# Patient Record
Sex: Female | Born: 1960 | Hispanic: No | Marital: Married | Smoking: Never smoker
Health system: Southern US, Community
[De-identification: ages and names within clinical notes are randomized; demographics above are authoritative.]

## PROBLEM LIST (undated history)

## (undated) DIAGNOSIS — G4733 Obstructive sleep apnea (adult) (pediatric): Secondary | ICD-10-CM

## (undated) DIAGNOSIS — E119 Type 2 diabetes mellitus without complications: Secondary | ICD-10-CM

## (undated) DIAGNOSIS — I10 Essential (primary) hypertension: Secondary | ICD-10-CM

## (undated) DIAGNOSIS — E7800 Pure hypercholesterolemia, unspecified: Secondary | ICD-10-CM

## (undated) DIAGNOSIS — R7303 Prediabetes: Secondary | ICD-10-CM

## (undated) DIAGNOSIS — G4730 Sleep apnea, unspecified: Secondary | ICD-10-CM

## (undated) DIAGNOSIS — E785 Hyperlipidemia, unspecified: Secondary | ICD-10-CM

## (undated) DIAGNOSIS — T7840XA Allergy, unspecified, initial encounter: Secondary | ICD-10-CM

## (undated) HISTORY — DX: Allergy, unspecified, initial encounter: T78.40XA

## (undated) HISTORY — DX: Pure hypercholesterolemia, unspecified: E78.00

## (undated) HISTORY — DX: Essential (primary) hypertension: I10

## (undated) HISTORY — DX: Obstructive sleep apnea (adult) (pediatric): G47.33

## (undated) HISTORY — DX: Hyperlipidemia, unspecified: E78.5

## (undated) HISTORY — DX: Prediabetes: R73.03

## (undated) HISTORY — DX: Sleep apnea, unspecified: G47.30

## (undated) HISTORY — PX: COLONOSCOPY: SHX174

## (undated) HISTORY — PX: WISDOM TOOTH EXTRACTION: SHX21

## (undated) HISTORY — DX: Type 2 diabetes mellitus without complications: E11.9

---

## 2008-06-26 ENCOUNTER — Encounter: Admission: RE | Admit: 2008-06-26 | Discharge: 2008-06-26 | Payer: Self-pay | Admitting: Internal Medicine

## 2009-01-30 ENCOUNTER — Encounter: Admission: RE | Admit: 2009-01-30 | Discharge: 2009-01-30 | Payer: Self-pay | Admitting: Internal Medicine

## 2012-03-21 ENCOUNTER — Other Ambulatory Visit: Payer: Self-pay | Admitting: Internal Medicine

## 2012-03-21 DIAGNOSIS — N63 Unspecified lump in unspecified breast: Secondary | ICD-10-CM

## 2012-03-29 ENCOUNTER — Ambulatory Visit
Admission: RE | Admit: 2012-03-29 | Discharge: 2012-03-29 | Disposition: A | Discharge status: Home / Self Care | Payer: BC Managed Care – PPO | Source: Ambulatory Visit | Attending: Internal Medicine | Admitting: Internal Medicine

## 2012-03-29 DIAGNOSIS — N63 Unspecified lump in unspecified breast: Secondary | ICD-10-CM

## 2012-04-25 ENCOUNTER — Encounter: Payer: Self-pay | Admitting: Pulmonary Disease

## 2012-04-25 ENCOUNTER — Ambulatory Visit (INDEPENDENT_AMBULATORY_CARE_PROVIDER_SITE_OTHER): Payer: BC Managed Care – PPO | Admitting: Pulmonary Disease

## 2012-04-25 VITALS — BP 100/60 | HR 92 | Temp 98.4°F | Ht 58.5 in | Wt 148.6 lb

## 2012-04-25 DIAGNOSIS — G4733 Obstructive sleep apnea (adult) (pediatric): Secondary | ICD-10-CM

## 2012-04-25 NOTE — Assessment & Plan Note (Signed)
Given excessive daytime somnolence, narrow pharyngeal exam, witnessed apneas & loud snoring, obstructive sleep apnea is very likely & an overnight polysomnogram will be scheduled as a split study. The pathophysiology of obstructive sleep apnea , it's cardiovascular consequences & modes of treatment including CPAP were discused with the patient in detail & they evidenced understanding. Given absence of comorbidities or other sleep disorders such as insomnia restless legs, I think this can safely be performed as a home study. I think pretest probability is high here.

## 2012-04-25 NOTE — Progress Notes (Signed)
Subjective:    Patient ID: Frances White, female    DOB: 12-27-1960, 51 y.o.   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 She has recently been diagnosed with hypertension. Her husband has noted loud snoring for many years and lately she has been complaining of non-refreshing sleep. Epworth sleepiness score is 7/24. Her dentist has noted that she likely grinds her teeth in her sleep. Bedtime is 11:30 to midnight, sleep latency is about 15 minutes but about once every 2 weeks she has difficulty falling asleep for an hour or 2. She sleeps on her side with one pillow. She is 2-4 awakenings every night and she suspects that this may be due to carpal tunnel syndrome, there is a bathroom visit around 5 AM. She does have postmenopausal symptoms but these did not seem to wake her up at night. She is out of bed at 7 AM feeling tired but denies headaches or dryness of mouth. She does report stiff joints in the morning that improves as the day goes by. She is gained about 10 pounds in the last few years. She drinks about 4 cups of tea in the daytime with her last cup being around 4 PM. She has tried nasal steroids without any change in her snoring. 3 mg of melatonin seem to give her better rest at night.. She denies obvious sadness of mood or overt signs of depression. There is no history suggestive of cataplexy, sleep paralysis or parasomnias     Past Medical History  Diagnosis Date  . High cholesterol   . Hypertension     No past surgical history on file.  Allergies  Allergen Reactions  . Sulfa Antibiotics     rash    History   Social History  . Marital Status: Unknown    Spouse Name: N/A    Number of Children: N/A  . Years of Education: N/A   Occupational History  . Not on file.   Social History Main Topics  . Smoking status: Never Smoker   . Smokeless tobacco: Not on file  . Alcohol Use: Yes     Comment:  socially  . Drug Use: No  . Sexually Active: Not on file   Other Topics Concern  . Not on file   Social History Narrative  . No narrative on file     Review of Systems  Constitutional: Negative for appetite change and unexpected weight change.  HENT: Positive for trouble swallowing and dental problem. Negative for ear pain, congestion, sore throat and sneezing.   Respiratory: Positive for shortness of breath. Negative for cough.   Cardiovascular: Negative for chest pain, palpitations and leg swelling.  Gastrointestinal: Negative for abdominal pain.  Musculoskeletal: Positive for arthralgias. Negative for joint swelling.  Skin: Negative for rash.  Neurological: Positive for headaches.  Psychiatric/Behavioral: Negative for dysphoric mood. The patient is nervous/anxious.        Objective:   Physical Exam  Gen. Pleasant, well-nourished, in no distress, normal affect ENT - no lesions, no post nasal drip, class 2 airway Neck: No JVD, no thyromegaly, no carotid bruits Lungs: no use of accessory muscles, no dullness to percussion, clear without rales or rhonchi  Cardiovascular: Rhythm regular, heart sounds  normal, no murmurs or gallops, no peripheral edema Abdomen: soft and non-tender, no hepatosplenomegaly, BS normal. Musculoskeletal: No deformities, no cyanosis or clubbing Neuro:  alert, non focal       Assessment &  Plan:

## 2012-04-25 NOTE — Patient Instructions (Signed)
Home sleep study Trial of melatonin about 2h before bedtime - 3mg  OK

## 2012-04-28 NOTE — Addendum Note (Signed)
Addended by: Tommie Sams on: 04/28/2012 03:07 PM   Modules accepted: Orders

## 2012-05-27 ENCOUNTER — Encounter (HOSPITAL_BASED_OUTPATIENT_CLINIC_OR_DEPARTMENT_OTHER): Payer: BC Managed Care – PPO

## 2012-06-07 ENCOUNTER — Encounter (HOSPITAL_BASED_OUTPATIENT_CLINIC_OR_DEPARTMENT_OTHER): Payer: BC Managed Care – PPO

## 2012-06-28 ENCOUNTER — Encounter (HOSPITAL_BASED_OUTPATIENT_CLINIC_OR_DEPARTMENT_OTHER): Payer: Self-pay | Admitting: Radiology

## 2012-06-28 ENCOUNTER — Ambulatory Visit (HOSPITAL_BASED_OUTPATIENT_CLINIC_OR_DEPARTMENT_OTHER): Payer: BC Managed Care – PPO | Attending: Pulmonary Disease | Admitting: Radiology

## 2012-06-28 VITALS — Ht 59.0 in | Wt 149.0 lb

## 2012-06-28 DIAGNOSIS — Z79899 Other long term (current) drug therapy: Secondary | ICD-10-CM | POA: Insufficient documentation

## 2012-06-28 DIAGNOSIS — G4733 Obstructive sleep apnea (adult) (pediatric): Secondary | ICD-10-CM | POA: Insufficient documentation

## 2012-06-28 DIAGNOSIS — G4700 Insomnia, unspecified: Secondary | ICD-10-CM | POA: Insufficient documentation

## 2012-06-28 DIAGNOSIS — Z9989 Dependence on other enabling machines and devices: Secondary | ICD-10-CM

## 2012-06-29 DIAGNOSIS — G4730 Sleep apnea, unspecified: Secondary | ICD-10-CM

## 2012-06-29 DIAGNOSIS — G4710 Hypersomnia, unspecified: Secondary | ICD-10-CM

## 2012-06-30 ENCOUNTER — Telehealth: Payer: Self-pay | Admitting: Pulmonary Disease

## 2012-06-30 DIAGNOSIS — G4733 Obstructive sleep apnea (adult) (pediatric): Secondary | ICD-10-CM

## 2012-06-30 NOTE — Procedures (Signed)
NAMEKELBI, RENSTROM NO.:  [id number]  MEDICAL RECORD NO.:  [id number]          PATIENT TYPE:  OUT  LOCATION:  SLEEP CENTER                 FACILITY:  [facility]  PHYSICIAN:  Oretha Milch, MD      DATE OF BIRTH:  [birth year]  DATE OF STUDY:  06/28/2012                           NOCTURNAL POLYSOMNOGRAM  REFERRING PHYSICIAN:  Oretha Milch, MD  INDICATION FOR THE STUDY:  Ms. Cowell is a 52 year old woman with hypertension, witnessed apneas, loud snoring, and excessive daytime fatigue.  At the time of this study, she weighed 149 pounds with a height of 4 feet 11 inches, BMI of 30, neck size of 13.5 inch.  EPWORTH SLEEPINESS SCORE:  5.  BEDTIME MEDICATIONS:  Included Crestor, Benicar, melatonin, and Lunesta at 9:30 p.m.  This intervention polysomnogram was performed with a sleep technologist in attendance.  EEG, EOG, EMG, EKG, and respiratory parameters were recorded.  Sleep stages, arousals, limb movements, and respiratory data were scored according to criteria laid out by the American Academy of Sleep Medicine.  SLEEP ARCHITECTURE:  Lights out was at 11:11 p.m., lights on was at 5:11 a.m.  CPAP was initiated at 2:05 a.m.  During the diagnostic portion, total sleep time was 139 minutes with a sleep period time of 161 minute with a sleep efficiency of 80%.  Sleep latency was 12 minutes.  Latency to REM sleep was 82 minutes.  Awake after sleep onset was 23 minutes. Sleep stages of the percentage of total sleep time was N1 11%, N2 81%. N3 1%, and REM sleep 7% (10 minute).  Supine sleep accounted for 41 minute.  During the titration portion, REM sleep accounted for 33 minutes (45%) and supine sleep was only noted for 4.5 minute.  Longest period of REM sleep was around 2:30 a.m.  RESPIRATORY DATA:  There were total of 46 obstructive apneas, 0 central apneas, 0 mixed apneas, and 49 hypopneas with an apnea-hypopnea index of 41 events per hour, and a low desaturation of  73% during the diagnostic portion.  The events appeared to be decreased in the right lateral decubitus position and were mostly noted while supine.  Desaturations were severe during REM sleep.  Due to this degree of respiratory disturbance, CPAP was initiated at 5 cm with a small fullface mask.  A level of 7 cm for 28 minutes including 9 minutes of REM sleep, 2 hypopneas were noted with an AHI of 4 events per hour and a low desaturation of 88%.  At a level of 8 cm for 16 minutes of sleep, 1 obstructive apnea, 1 central apnea, and 3 hypopneas were noted. Titration was suboptimal due to lack of supine sleep.  AROUSAL DATA:  During the diagnostic portion, the arousal index was 28 events per hour.  During the titration portion, the arousal index was 12 events per hour.  OXYGEN SATURATION DATA:  The desaturation index was 35 events per hour during diagnostic portion, and during the titration portion, she spent 0.2 minutes with a saturation less than 88%, the lowest desaturation of 88%.  CARDIAC DATA:  The low heart rate was 52 beats per minute.  The high heart rate was 118 beats per minute.  No arrhythmias were noted.  DISCUSSION:  She was desensitized with a small fullface mask; 2 cm of CPR was supplied.  She was unable to maintain sleep after 4 a.m. in spite of taking Lunesta and melatonin at bedtime.  IMPRESSION: 1. Moderate obstructive sleep apnea with hypopneas causing sleep     fragmentation and moderate oxygen desaturations.  This was     especially severe during supine REM sleep and appeared improved in     the right lateral decubitus position. 2. Insomnia of sleep maintenance in spite of melatonin and Lunesta. 3. CPAP of 8 cm with a small fullface mask seemed to correct the     respiratory disturbance. 4. No evidence of limb movements, cardiac arrhythmias, or behavioral     disturbance during sleep.  RECOMMENDATION: 1. The treatment options for this degree of  sleep-disordered breathing     include weight loss and/or CPAP therapy. 2. CPAP can be initiated at 8 cm with a small fullface mask and     compliance monitored at this level. 3. She should be advised against medications, sedative side effects.     She should be cautioned against driving when sleepy.  The sleep     aids are to be continued for insomnia of sleep maintenance.  CPAP     would be advised.     Oretha Milch, MD    RVA/MEDQ  D:  06/29/2012 13:52:50  T:  06/30/2012 01:04:11  Job:  [id number]  cc:   Larina Earthly, M.D. Fax: [phone]

## 2012-06-30 NOTE — Telephone Encounter (Signed)
Discussed psg results Order sent to DME for CPAP 8 cm, nasal pillows, humidity, download in 4 wks

## 2012-07-25 ENCOUNTER — Telehealth: Payer: Self-pay | Admitting: Pulmonary Disease

## 2012-07-25 DIAGNOSIS — G4733 Obstructive sleep apnea (adult) (pediatric): Secondary | ICD-10-CM

## 2012-07-25 NOTE — Telephone Encounter (Signed)
Pt is aware of RA, recommendation Order will be placed in system.

## 2012-07-25 NOTE — Telephone Encounter (Signed)
Headache x 3ds with nasal pillows  Pl send Rx for change to nasal mask to DME this am & inform pt

## 2012-07-26 ENCOUNTER — Telehealth: Payer: Self-pay | Admitting: Pulmonary Disease

## 2012-07-26 NOTE — Telephone Encounter (Signed)
Order faxed to [facility] to get new cpap mask Tobe Sos

## 2012-08-30 ENCOUNTER — Telehealth: Payer: Self-pay | Admitting: Pulmonary Disease

## 2012-08-30 NOTE — Telephone Encounter (Signed)
ATC PT LINE BUSY WCB 

## 2012-08-30 NOTE — Telephone Encounter (Signed)
CPAP 8 cm  is very effective & cuts down events to 5/hr She is using on avg 5h on nights used Can we do anything to get her more comfortable?

## 2012-08-31 NOTE — Telephone Encounter (Signed)
lmomtcb x1 

## 2012-09-01 NOTE — Telephone Encounter (Signed)
lmomtcb x2 for pt 

## 2012-09-02 NOTE — Telephone Encounter (Signed)
I spoke with patient about results and she verbalized understanding and had no questions. She stated she has the medium nasal pillows and it cuts into her nose. She is not sure if anything can be done about this but any recs. Please advise RA thanks

## 2012-09-07 NOTE — Telephone Encounter (Signed)
Discussed - vaseline, overhead holder for hose No other issues, compliance ok

## 2013-02-14 ENCOUNTER — Telehealth: Payer: Self-pay

## 2013-02-14 NOTE — Telephone Encounter (Signed)
Message copied by Chrystie Nose on Tue Feb 14, 2013  2:06 PM ------      Message from: Hilarie Fredrickson      Created: Tue Feb 14, 2013  2:00 PM      Regarding: RE: Colonoscopy       If that works for them, that would be terrific. Thank you                  ----- Message -----         From: Lily Lovings, RN         Sent: 02/14/2013   1:29 PM           To: Hilarie Fredrickson, MD      Subject: Colonoscopy                                              Dr. Marina Goodell,            Dr. Vicente Males wife and he are scheduled for colon's with you on 02/23/13. They would like to reschedule her appt to another day in October. There were no openings but I wondered if perhaps She could be done 03/09/13 at 10:30am. That is your hospital week and you are in the [facility] that morning.            I spoke with Dr. Felipa Eth and let him know I would have to check with you and call them back.            Thanks,      Bonita Quin        ------

## 2013-02-14 NOTE — Telephone Encounter (Signed)
Pts colon moved to 03/09/13@10 :30am. Pt aware of appt and will keep scheduled previsit appt also.

## 2013-02-17 ENCOUNTER — Encounter: Payer: Self-pay | Admitting: Internal Medicine

## 2013-02-17 ENCOUNTER — Ambulatory Visit (AMBULATORY_SURGERY_CENTER): Payer: Self-pay

## 2013-02-17 VITALS — Ht 59.0 in | Wt 146.0 lb

## 2013-02-17 DIAGNOSIS — Z1211 Encounter for screening for malignant neoplasm of colon: Secondary | ICD-10-CM

## 2013-02-17 MED ORDER — MOVIPREP 100 G PO SOLR: 1.0000 | Freq: Once | ORAL | Status: DC

## 2013-02-23 ENCOUNTER — Encounter: Payer: BC Managed Care – PPO | Admitting: Internal Medicine

## 2013-03-09 ENCOUNTER — Ambulatory Visit (AMBULATORY_SURGERY_CENTER): Payer: BC Managed Care – PPO | Admitting: Internal Medicine

## 2013-03-09 ENCOUNTER — Encounter: Payer: Self-pay | Admitting: Internal Medicine

## 2013-03-09 VITALS — BP 111/75 | HR 75 | Temp 98.2°F | Resp 14 | Ht 59.0 in | Wt 146.0 lb

## 2013-03-09 DIAGNOSIS — Z1211 Encounter for screening for malignant neoplasm of colon: Secondary | ICD-10-CM

## 2013-03-09 DIAGNOSIS — D135 Benign neoplasm of extrahepatic bile ducts: Secondary | ICD-10-CM

## 2013-03-09 DIAGNOSIS — D134 Benign neoplasm of liver: Secondary | ICD-10-CM

## 2013-03-09 DIAGNOSIS — D126 Benign neoplasm of colon, unspecified: Secondary | ICD-10-CM

## 2013-03-09 MED ORDER — SODIUM CHLORIDE 0.9 % IV SOLN: 500.0000 mL | INTRAVENOUS | Status: DC

## 2013-03-09 NOTE — Progress Notes (Signed)
Patient did not experience any of the following events: a burn prior to discharge; a fall within the facility; wrong site/side/patient/procedure/implant event; or a hospital transfer or hospital admission upon discharge from the facility. (G8907)Patient did not have preoperative order for IV antibiotic SSI prophylaxis. (G8918) ewm 

## 2013-03-09 NOTE — Op Note (Signed)
[facility] [address] [place], [address]   COLONOSCOPY PROCEDURE REPORT  PATIENT: Frances White, Frances White  MR#: [id number] BIRTHDATE: 03/10/1961 , 52  yrs. old GENDER: Female ENDOSCOPIST: Roxy Cedar, MD REFERRED XB:JYNWGN Eloise Harman, M.D. PROCEDURE DATE:  03/09/2013 PROCEDURE:   Colonoscopy with snare polypectomy x 4 and Colonoscopy with biopsy x 1 First Screening Colonoscopy - Avg.  risk and is 50 yrs.  old or older Yes.  Prior Negative Screening - Now for repeat screening. N/A  History of Adenoma - Now for follow-up colonoscopy & has been > or = to 3 yrs.  N/A  Polyps Removed Today? Yes. ASA CLASS:   Class II INDICATIONS:average risk screening. MEDICATIONS: MAC sedation, administered by CRNA and propofol (Diprivan) 300mg  IV DESCRIPTION OF PROCEDURE:   After the risks benefits and alternatives of the procedure were thoroughly explained, informed consent was obtained.  A digital rectal exam revealed no abnormalities of the rectum.   The LB [id number]  endoscope was introduced through the anus and advanced to the cecum, which was identified by both the appendix and ileocecal valve. No adverse events experienced.   The quality of the prep was excellent, using MoviPrep  The instrument was then slowly withdrawn as the colon was fully examined.       COLON FINDINGS: Five polyps were found. 1mm in the cecum removed with cold biopsy forceps. 3mm, 5mm, 8mm in the ascending colon and 7mm transverse colon - all removed with cold snare. All polyps retrieved and submitted to pathology.  The colon was otherwise normal.  There was no diverticulosis, inflammation,other polyps or cancers .  Retroflexed views revealed no abnormalities. The time to cecum=2 minutes 14 seconds.  Withdrawal time=13 minutes 48 seconds. The scope was withdrawn and the procedure completed.  COMPLICATIONS: There were no complications.  ENDOSCOPIC IMPRESSION: 1.   Five polyps were found and  removed as described 2.   The colon was otherwise normal  RECOMMENDATIONS: 1. Repeat Colonoscopy in 3 years if 3 or more adenomas.   eSigned:  Roxy Cedar, MD 03/09/2013 11:13 AM   cc: Jarome Matin, MD and The Patient   PATIENT NAME:  Frances White, Frances White MR#: [id number]

## 2013-03-09 NOTE — Patient Instructions (Signed)
YOU HAD AN ENDOSCOPIC PROCEDURE TODAY AT THE [facility]: Refer to the procedure report that was given to you for any specific questions about what was found during the examination.  If the procedure report does not answer your questions, please call your gastroenterologist to clarify.  If you requested that your care partner not be given the details of your procedure findings, then the procedure report has been included in a sealed envelope for you to review at your convenience later.  YOU SHOULD EXPECT: Some feelings of bloating in the abdomen. Passage of more gas than usual.  Walking can help get rid of the air that was put into your GI tract during the procedure and reduce the bloating. If you had a lower endoscopy (such as a colonoscopy or flexible sigmoidoscopy) you may notice spotting of blood in your stool or on the toilet paper. If you underwent a bowel prep for your procedure, then you may not have a normal bowel movement for a few days.  DIET: Your first meal following the procedure should be a light meal and then it is ok to progress to your normal diet.  A half-sandwich or bowl of soup is an example of a good first meal.  Heavy or fried foods are harder to digest and may make you feel nauseous or bloated.  Likewise meals heavy in dairy and vegetables can cause extra gas to form and this can also increase the bloating.  Drink plenty of fluids but you should avoid alcoholic beverages for 24 hours.  ACTIVITY: Your care partner should take you home directly after the procedure.  You should plan to take it easy, moving slowly for the rest of the day.  You can resume normal activity the day after the procedure however you should NOT DRIVE or use heavy machinery for 24 hours (because of the sedation medicines used during the test).    SYMPTOMS TO REPORT IMMEDIATELY: A gastroenterologist can be reached at any hour.  During normal business hours, 8:30 AM to 5:00 PM Monday through Friday,  call [phone].  After hours and on weekends, please call the GI answering service at [phone]  Emergency number who will take a message and have the physician on call contact you.   Following lower endoscopy (colonoscopy or flexible sigmoidoscopy):  Excessive amounts of blood in the stool  Significant tenderness or worsening of abdominal pains  Swelling of the abdomen that is new, acute  Fever of 100F or higher  FOLLOW UP: If any biopsies were taken you will be contacted by phone or by letter within the next 1-3 weeks.  Call your gastroenterologist if you have not heard about the biopsies in 3 weeks.  Our staff will call the home number listed on your records the next business day following your procedure to check on you and address any questions or concerns that you may have at that time regarding the information given to you following your procedure. This is a courtesy call and so if there is no answer at the home number and we have not heard from you through the emergency physician on call, we will assume that you have returned to your regular daily activities without incident.  SIGNATURES/CONFIDENTIALITY: You and/or your care partner have signed paperwork which will be entered into your electronic medical record.  These signatures attest to the fact that that the information above on your After Visit Summary has been reviewed and is understood.  Full responsibility of the   confidentiality of this discharge information lies with you and/or your care-partner. 

## 2013-03-09 NOTE — Progress Notes (Signed)
Called to room to assist during endoscopic procedure.  Patient ID and intended procedure confirmed with present staff. Received instructions for my participation in the procedure from the performing physician.  

## 2013-03-10 ENCOUNTER — Telehealth: Payer: Self-pay | Admitting: *Deleted

## 2013-03-10 NOTE — Telephone Encounter (Signed)
  Follow up Call-  Call back number 03/09/2013  Post procedure Call Back phone  # [phone]  Permission to leave phone message Yes     Patient questions:  Do you have a fever, pain , or abdominal swelling? no Pain Score  0 *  Have you tolerated food without any problems? yes  Have you been able to return to your normal activities? yes  Do you have any questions about your discharge instructions: Diet   no Medications  no Follow up visit  no  Do you have questions or concerns about your Care? no  Actions: * If pain score is 4 or above: No action needed, pain <4.

## 2013-03-14 ENCOUNTER — Encounter: Payer: Self-pay | Admitting: Internal Medicine

## 2015-11-21 DIAGNOSIS — N390 Urinary tract infection, site not specified: Secondary | ICD-10-CM | POA: Diagnosis not present

## 2015-11-21 DIAGNOSIS — R8299 Other abnormal findings in urine: Secondary | ICD-10-CM | POA: Diagnosis not present

## 2015-11-26 DIAGNOSIS — R300 Dysuria: Secondary | ICD-10-CM | POA: Diagnosis not present

## 2015-11-26 DIAGNOSIS — I10 Essential (primary) hypertension: Secondary | ICD-10-CM | POA: Diagnosis not present

## 2015-12-09 DIAGNOSIS — R300 Dysuria: Secondary | ICD-10-CM | POA: Diagnosis not present

## 2015-12-09 DIAGNOSIS — N390 Urinary tract infection, site not specified: Secondary | ICD-10-CM | POA: Diagnosis not present

## 2015-12-19 DIAGNOSIS — R3915 Urgency of urination: Secondary | ICD-10-CM | POA: Insufficient documentation

## 2015-12-19 DIAGNOSIS — R339 Retention of urine, unspecified: Secondary | ICD-10-CM | POA: Diagnosis not present

## 2015-12-19 DIAGNOSIS — R319 Hematuria, unspecified: Secondary | ICD-10-CM | POA: Insufficient documentation

## 2015-12-19 DIAGNOSIS — R300 Dysuria: Secondary | ICD-10-CM | POA: Diagnosis not present

## 2015-12-27 ENCOUNTER — Encounter: Payer: Self-pay | Admitting: Internal Medicine

## 2015-12-30 ENCOUNTER — Other Ambulatory Visit: Payer: Self-pay | Admitting: Internal Medicine

## 2015-12-30 DIAGNOSIS — Z1231 Encounter for screening mammogram for malignant neoplasm of breast: Secondary | ICD-10-CM

## 2016-01-09 ENCOUNTER — Ambulatory Visit
Admission: RE | Admit: 2016-01-09 | Discharge: 2016-01-09 | Disposition: A | Discharge status: Home / Self Care | Payer: BLUE CROSS/BLUE SHIELD | Source: Ambulatory Visit | Attending: Internal Medicine | Admitting: Internal Medicine

## 2016-01-09 DIAGNOSIS — Z1231 Encounter for screening mammogram for malignant neoplasm of breast: Secondary | ICD-10-CM

## 2016-01-21 DIAGNOSIS — R339 Retention of urine, unspecified: Secondary | ICD-10-CM | POA: Diagnosis not present

## 2016-01-28 DIAGNOSIS — R3129 Other microscopic hematuria: Secondary | ICD-10-CM | POA: Insufficient documentation

## 2016-01-28 DIAGNOSIS — N9489 Other specified conditions associated with female genital organs and menstrual cycle: Secondary | ICD-10-CM | POA: Diagnosis not present

## 2016-01-28 DIAGNOSIS — R399 Unspecified symptoms and signs involving the genitourinary system: Secondary | ICD-10-CM | POA: Diagnosis not present

## 2016-01-28 DIAGNOSIS — R319 Hematuria, unspecified: Secondary | ICD-10-CM | POA: Diagnosis not present

## 2016-02-24 DIAGNOSIS — G4733 Obstructive sleep apnea (adult) (pediatric): Secondary | ICD-10-CM | POA: Diagnosis not present

## 2016-03-18 DIAGNOSIS — Z23 Encounter for immunization: Secondary | ICD-10-CM | POA: Diagnosis not present

## 2016-05-19 DIAGNOSIS — Z0000 Encounter for general adult medical examination without abnormal findings: Secondary | ICD-10-CM | POA: Diagnosis not present

## 2016-05-25 DIAGNOSIS — Z0000 Encounter for general adult medical examination without abnormal findings: Secondary | ICD-10-CM | POA: Diagnosis not present

## 2016-05-26 DIAGNOSIS — I10 Essential (primary) hypertension: Secondary | ICD-10-CM | POA: Diagnosis not present

## 2016-05-26 DIAGNOSIS — D126 Benign neoplasm of colon, unspecified: Secondary | ICD-10-CM | POA: Diagnosis not present

## 2016-05-26 DIAGNOSIS — Z0000 Encounter for general adult medical examination without abnormal findings: Secondary | ICD-10-CM | POA: Diagnosis not present

## 2016-05-26 DIAGNOSIS — G4733 Obstructive sleep apnea (adult) (pediatric): Secondary | ICD-10-CM | POA: Diagnosis not present

## 2016-05-26 DIAGNOSIS — E784 Other hyperlipidemia: Secondary | ICD-10-CM | POA: Diagnosis not present

## 2016-05-26 DIAGNOSIS — Z1389 Encounter for screening for other disorder: Secondary | ICD-10-CM | POA: Diagnosis not present

## 2016-07-27 ENCOUNTER — Encounter: Payer: Self-pay | Admitting: Internal Medicine

## 2016-10-13 DIAGNOSIS — H353131 Nonexudative age-related macular degeneration, bilateral, early dry stage: Secondary | ICD-10-CM | POA: Diagnosis not present

## 2016-12-08 ENCOUNTER — Encounter: Payer: Self-pay | Admitting: Internal Medicine

## 2017-02-03 ENCOUNTER — Ambulatory Visit (AMBULATORY_SURGERY_CENTER): Payer: Self-pay | Admitting: *Deleted

## 2017-02-03 VITALS — Ht 59.0 in | Wt 150.0 lb

## 2017-02-03 DIAGNOSIS — Z1211 Encounter for screening for malignant neoplasm of colon: Secondary | ICD-10-CM

## 2017-02-03 MED ORDER — NA SULFATE-K SULFATE-MG SULF 17.5-3.13-1.6 GM/177ML PO SOLN: 1.0000 | Freq: Once | ORAL | 0 refills | Status: AC

## 2017-02-03 NOTE — Progress Notes (Signed)
Patient denies any allergies to eggs or soy. Patient denies any problems with anesthesia/sedation. Patient denies any oxygen use at home and does not take any diet/weight loss medications. EMMI education assisgned to patient on colonoscopy, this was explained and instructions given to patient. 

## 2017-02-05 ENCOUNTER — Encounter: Payer: Self-pay | Admitting: Internal Medicine

## 2017-02-16 ENCOUNTER — Encounter: Payer: Self-pay | Admitting: Internal Medicine

## 2017-02-16 ENCOUNTER — Ambulatory Visit (AMBULATORY_SURGERY_CENTER): Payer: BLUE CROSS/BLUE SHIELD | Admitting: Internal Medicine

## 2017-02-16 VITALS — BP 107/70 | HR 76 | Temp 97.3°F | Resp 12 | Ht 59.0 in | Wt 150.0 lb

## 2017-02-16 DIAGNOSIS — D122 Benign neoplasm of ascending colon: Secondary | ICD-10-CM

## 2017-02-16 DIAGNOSIS — Z86010 Personal history of colonic polyps: Secondary | ICD-10-CM | POA: Diagnosis present

## 2017-02-16 DIAGNOSIS — Z1211 Encounter for screening for malignant neoplasm of colon: Secondary | ICD-10-CM | POA: Diagnosis not present

## 2017-02-16 MED ORDER — SODIUM CHLORIDE 0.9 % IV SOLN: 500.0000 mL | INTRAVENOUS | Status: DC

## 2017-02-16 NOTE — Progress Notes (Signed)
Spontaneous respirations throughout. VSS. Resting comfortably. To PACU on room air. Report to  RN. 

## 2017-02-16 NOTE — Patient Instructions (Signed)
**  Handouts given on polyps **   YOU HAD AN ENDOSCOPIC PROCEDURE TODAY AT THE [facility]:   Refer to the procedure report that was given to you for any specific questions about what was found during the examination.  If the procedure report does not answer your questions, please call your gastroenterologist to clarify.  If you requested that your care partner not be given the details of your procedure findings, then the procedure report has been included in a sealed envelope for you to review at your convenience later.  YOU SHOULD EXPECT: Some feelings of bloating in the abdomen. Passage of more gas than usual.  Walking can help get rid of the air that was put into your GI tract during the procedure and reduce the bloating. If you had a lower endoscopy (such as a colonoscopy or flexible sigmoidoscopy) you may notice spotting of blood in your stool or on the toilet paper. If you underwent a bowel prep for your procedure, you may not have a normal bowel movement for a few days.  Please Note:  You might notice some irritation and congestion in your nose or some drainage.  This is from the oxygen used during your procedure.  There is no need for concern and it should clear up in a day or so.  SYMPTOMS TO REPORT IMMEDIATELY:   Following lower endoscopy (colonoscopy or flexible sigmoidoscopy):  Excessive amounts of blood in the stool  Significant tenderness or worsening of abdominal pains  Swelling of the abdomen that is new, acute  Fever of 100F or higher  For urgent or emergent issues, a gastroenterologist can be reached at any hour by calling [phone].   DIET:  We do recommend a small meal at first, but then you may proceed to your regular diet.  Drink plenty of fluids but you should avoid alcoholic beverages for 24 hours.  ACTIVITY:  You should plan to take it easy for the rest of today and you should NOT DRIVE or use heavy machinery until tomorrow (because of the sedation  medicines used during the test).    FOLLOW UP: Our staff will call the number listed on your records the next business day following your procedure to check on you and address any questions or concerns that you may have regarding the information given to you following your procedure. If we do not reach you, we will leave a message.  However, if you are feeling well and you are not experiencing any problems, there is no need to return our call.  We will assume that you have returned to your regular daily activities without incident.  If any biopsies were taken you will be contacted by phone or by letter within the next 1-3 weeks.  Please call us at [phone] if you have not heard about the biopsies in 3 weeks.    SIGNATURES/CONFIDENTIALITY: You and/or your care partner have signed paperwork which will be entered into your electronic medical record.  These signatures attest to the fact that that the information above on your After Visit Summary has been reviewed and is understood.  Full responsibility of the confidentiality of this discharge information lies with you and/or your care-partner. 

## 2017-02-16 NOTE — Progress Notes (Signed)
Called to room to assist during endoscopic procedure.  Patient ID and intended procedure confirmed with present staff. Received instructions for my participation in the procedure from the performing physician.  

## 2017-02-16 NOTE — Op Note (Signed)
[place] Patient Name: Frances White Procedure Date: 02/16/2017 10:01 AM MRN: [id number] Endoscopist: Docia Chuck. Henrene Pastor , MD Age: 56 Referring MD:  Date of Birth: 1960-09-29 Gender: Female Account #: [id number] Procedure:                Colonoscopy, with cold snare polypectomy x 1 Indications:              High risk colon cancer surveillance: Personal                            history of multiple (3 or more) adenomas. Index                            examination October 2014 Medicines:                Monitored Anesthesia Care Procedure:                Pre-Anesthesia Assessment:                           - Prior to the procedure, a History and Physical                            was performed, and patient medications and                            allergies were reviewed. The patient's tolerance of                            previous anesthesia was also reviewed. The risks                            and benefits of the procedure and the sedation                            options and risks were discussed with the patient.                            All questions were answered, and informed consent                            was obtained. Prior Anticoagulants: The patient has                            taken no previous anticoagulant or antiplatelet                            agents. ASA Grade Assessment: II - A patient with                            mild systemic disease. After reviewing the risks                            and benefits, the patient was deemed in  satisfactory condition to undergo the procedure.                           After obtaining informed consent, the colonoscope                            was passed under direct vision. Throughout the                            procedure, the patient's blood pressure, pulse, and                            oxygen saturations were monitored continuously. The                            Colonoscope  was introduced through the anus and                            advanced to the the cecum, identified by                            appendiceal orifice and ileocecal valve. The                            terminal ileum, ileocecal valve, appendiceal                            orifice, and rectum were photographed. The quality                            of the bowel preparation was good. The colonoscopy                            was performed without difficulty. The patient                            tolerated the procedure well. The bowel preparation                            used was SUPREP. Scope In: 10:07:46 AM Scope Out: 10:20:54 AM Scope Withdrawal Time: 0 hours 11 minutes 42 seconds  Total Procedure Duration: 0 hours 13 minutes 8 seconds  Findings:                 The terminal ileum appeared normal.                           A 1 mm polyp was found in the ascending colon. The                            polyp was removed with a cold snare. Resection and                            retrieval were complete.  The exam was otherwise without abnormality on                            direct and retroflexion views. Complications:            No immediate complications. Estimated blood loss:                            None. Estimated Blood Loss:     Estimated blood loss: none. Impression:               - The examined portion of the ileum was normal.                           - One 1 mm polyp in the ascending colon, removed                            with a cold snare. Resected and retrieved.                           - The examination was otherwise normal on direct                            and retroflexion views. Recommendation:           - Repeat colonoscopy in 5 years for surveillance.                           - Patient has a contact number available for                            emergencies. The signs and symptoms of potential                            delayed  complications were discussed with the                            patient. Return to normal activities tomorrow.                            Written discharge instructions were provided to the                            patient.                           - Resume previous diet.                           - Continue present medications.                           - Await pathology results. Docia Chuck. Henrene Pastor, MD 02/16/2017 10:30:53 AM This report has been signed electronically.

## 2017-02-17 ENCOUNTER — Telehealth: Payer: Self-pay | Admitting: *Deleted

## 2017-02-17 NOTE — Telephone Encounter (Signed)
  Follow up Call-  Call back number 02/16/2017  Post procedure Call Back phone  # [phone]  Permission to leave phone message Yes  Some recent data might be hidden     Patient questions:  Do you have a fever, pain , or abdominal swelling? No. Pain Score  0 *  Have you tolerated food without any problems? Yes.    Have you been able to return to your normal activities? Yes.    Do you have any questions about your discharge instructions: Diet   No. Medications  No. Follow up visit  No.  Do you have questions or concerns about your Care? No.  Actions: * If pain score is 4 or above: No action needed, pain <4.

## 2017-02-22 ENCOUNTER — Encounter: Payer: Self-pay | Admitting: Internal Medicine

## 2017-05-21 DIAGNOSIS — Z0000 Encounter for general adult medical examination without abnormal findings: Secondary | ICD-10-CM | POA: Diagnosis not present

## 2017-05-21 DIAGNOSIS — I10 Essential (primary) hypertension: Secondary | ICD-10-CM | POA: Diagnosis not present

## 2017-05-27 DIAGNOSIS — I10 Essential (primary) hypertension: Secondary | ICD-10-CM | POA: Diagnosis not present

## 2017-05-27 DIAGNOSIS — R7309 Other abnormal glucose: Secondary | ICD-10-CM | POA: Diagnosis not present

## 2017-05-27 DIAGNOSIS — Z0000 Encounter for general adult medical examination without abnormal findings: Secondary | ICD-10-CM | POA: Diagnosis not present

## 2017-05-27 DIAGNOSIS — M722 Plantar fascial fibromatosis: Secondary | ICD-10-CM | POA: Diagnosis not present

## 2017-06-09 DIAGNOSIS — Z23 Encounter for immunization: Secondary | ICD-10-CM | POA: Diagnosis not present

## 2018-01-24 DIAGNOSIS — Z23 Encounter for immunization: Secondary | ICD-10-CM | POA: Diagnosis not present

## 2018-01-31 DIAGNOSIS — Z6831 Body mass index (BMI) 31.0-31.9, adult: Secondary | ICD-10-CM | POA: Diagnosis not present

## 2018-01-31 DIAGNOSIS — R05 Cough: Secondary | ICD-10-CM | POA: Diagnosis not present

## 2018-01-31 DIAGNOSIS — J019 Acute sinusitis, unspecified: Secondary | ICD-10-CM | POA: Diagnosis not present

## 2018-01-31 DIAGNOSIS — J209 Acute bronchitis, unspecified: Secondary | ICD-10-CM | POA: Diagnosis not present

## 2018-02-23 ENCOUNTER — Other Ambulatory Visit: Payer: Self-pay | Admitting: Internal Medicine

## 2018-02-23 DIAGNOSIS — Z1231 Encounter for screening mammogram for malignant neoplasm of breast: Secondary | ICD-10-CM

## 2018-02-24 ENCOUNTER — Ambulatory Visit
Admission: RE | Admit: 2018-02-24 | Discharge: 2018-02-24 | Disposition: A | Discharge status: Home / Self Care | Payer: BLUE CROSS/BLUE SHIELD | Source: Ambulatory Visit | Attending: Internal Medicine | Admitting: Internal Medicine

## 2018-02-24 DIAGNOSIS — Z1231 Encounter for screening mammogram for malignant neoplasm of breast: Secondary | ICD-10-CM

## 2018-02-25 DIAGNOSIS — J019 Acute sinusitis, unspecified: Secondary | ICD-10-CM | POA: Diagnosis not present

## 2018-02-25 DIAGNOSIS — Z6831 Body mass index (BMI) 31.0-31.9, adult: Secondary | ICD-10-CM | POA: Diagnosis not present

## 2018-05-22 ENCOUNTER — Telehealth: Payer: Self-pay | Admitting: Pulmonary Disease

## 2018-05-22 DIAGNOSIS — G4733 Obstructive sleep apnea (adult) (pediatric): Secondary | ICD-10-CM

## 2018-05-22 NOTE — Telephone Encounter (Signed)
Spouse reports increased snoring. I believe that CPAP is set at 8 cm. Please send order to DME/[facility] or new DME to change to auto CPAP 5 to 12 cm and follow-up OV with download in 1 month

## 2018-05-26 DIAGNOSIS — Z0000 Encounter for general adult medical examination without abnormal findings: Secondary | ICD-10-CM | POA: Diagnosis not present

## 2018-05-26 DIAGNOSIS — R7309 Other abnormal glucose: Secondary | ICD-10-CM | POA: Diagnosis not present

## 2018-05-26 NOTE — Telephone Encounter (Signed)
Spoke with pt's spouse Einar Grad (dpr on file), aware of recs.  Order placed to change pressure.  rov scheduled.  Nothing further needed at this time.

## 2018-06-09 DIAGNOSIS — I10 Essential (primary) hypertension: Secondary | ICD-10-CM | POA: Diagnosis not present

## 2018-06-09 DIAGNOSIS — Z1331 Encounter for screening for depression: Secondary | ICD-10-CM | POA: Diagnosis not present

## 2018-06-09 DIAGNOSIS — G4733 Obstructive sleep apnea (adult) (pediatric): Secondary | ICD-10-CM | POA: Diagnosis not present

## 2018-06-09 DIAGNOSIS — E7849 Other hyperlipidemia: Secondary | ICD-10-CM | POA: Diagnosis not present

## 2018-06-09 DIAGNOSIS — R7309 Other abnormal glucose: Secondary | ICD-10-CM | POA: Diagnosis not present

## 2018-06-09 DIAGNOSIS — Z0000 Encounter for general adult medical examination without abnormal findings: Secondary | ICD-10-CM | POA: Diagnosis not present

## 2018-06-22 ENCOUNTER — Telehealth: Payer: Self-pay | Admitting: Pulmonary Disease

## 2018-06-22 DIAGNOSIS — G4733 Obstructive sleep apnea (adult) (pediatric): Secondary | ICD-10-CM

## 2018-06-22 NOTE — Telephone Encounter (Signed)
Please see previous phone note from 1/12 and confirm with [facility] that order was received

## 2018-06-22 NOTE — Telephone Encounter (Signed)
Called and spoke with [facility] they never received the order for pressure change I have replaced the order and I have left a message for the patient to make her aware that this is being taken care of will route to [facility].

## 2018-06-22 NOTE — Telephone Encounter (Signed)
Waiting for the order to be signed first then will send it

## 2018-06-22 NOTE — Telephone Encounter (Signed)
Called and spoke with patient, she stated that her husband called and spoke with RA in regards to having her CPAP pressures changed. I do not see any documentation on this patient since 2014. Patient does have an upcoming visit with RA in March. Patient would like her pressure changed before then and have someone call her back. I have called [facility] and was placed on hold. Will call them back. RA please advise thank you.

## 2018-06-23 NOTE — Telephone Encounter (Signed)
I sent this this morning

## 2018-07-01 ENCOUNTER — Encounter: Payer: Self-pay | Admitting: General Surgery

## 2018-07-04 ENCOUNTER — Encounter: Payer: Self-pay | Admitting: General Surgery

## 2018-07-04 ENCOUNTER — Encounter: Payer: Self-pay | Admitting: Pulmonary Disease

## 2018-07-04 ENCOUNTER — Ambulatory Visit (INDEPENDENT_AMBULATORY_CARE_PROVIDER_SITE_OTHER): Payer: BLUE CROSS/BLUE SHIELD | Admitting: Pulmonary Disease

## 2018-07-04 DIAGNOSIS — G4721 Circadian rhythm sleep disorder, delayed sleep phase type: Secondary | ICD-10-CM

## 2018-07-04 DIAGNOSIS — G4733 Obstructive sleep apnea (adult) (pediatric): Secondary | ICD-10-CM | POA: Diagnosis not present

## 2018-07-04 NOTE — Assessment & Plan Note (Addendum)
Severe, mostly supine  We have changed her to auto settings 5 to 12 cm and will await download which were obtained in the next 2 to 3 weeks and tweak settings if required. She would be eligible for a new machine We discussed some change in her bedtime routine to ensure she that she has a longer duration on CPAP  Weight loss encouraged, compliance with goal of at least 4-6 hrs every night is the expectation. Advised against medications with sedative side effects Cautioned against driving when sleepy - understanding that sleepiness will vary on a day to day basis

## 2018-07-04 NOTE — Progress Notes (Signed)
Subjective:    Patient ID: Frances White, female    DOB: Mar 18, 1961, 58 y.o.   MRN: [id number]  HPI  53 year old pleasant neighbor of mine who presents to reestablish care for OSA. She was diagnosed in 2014 when overnight sleep study which showed severe OSA mostly in the supine position events were fewer in the lateral position and were corrected by CPAP of 8 cm with a small full facemask.  Based on the study she was started on CPAP and had good results with improvement in her daytime somnolence and fatigue.  She is settled with nasal pillows. Her husband has reported over the past few months that there is snoring in spite of CPAP and wondered if settings need to be recalibrated.  We changed her to auto settings 5 to 12 cm, this change was done about a week ago by DME. She denies problems with mask interface or pressure.  She has been taking 5 mg of melatonin for the past few years now.  She reports nocturnal awakenings which she attributes to either hissing sound from air leaking out through the house at that point of contact with the nasal pillows or due to wrist pain which he attributes to carpal tunnel syndrome.  Epworth sleepiness score is 8. Bedtime is around 10:30 PM, she generally will read a book on her Melanee Spry , husband likes to turn off the light so she will sometimes sleep Chest syndrome and we are treating until she falls asleep.  Many times it is about 3 AM before she gets back into her bedroom and puts on the CPAP.  She sleeps well until wake-up time which can be around 730 to 8:30 AM, feels rested without dryness of mouth or headaches. She will take an occasional nap once every 2 weeks for about 30 minutes, naps are refreshing. There is no history suggestive of cataplexy, sleep paralysis or parasomnias  Her weight is unchanged over the past 6 years.  Blood pressure is controlled on one medication and she is now a prediabetic   Significant tests/ events reviewed  NPSG 06/2012 -  149lbs -  AHI 41/h , lowest 73 %, mostly supine >> CPAP 8 cm , small FF mask   Past Medical History:  Diagnosis Date  . Allergy   . High cholesterol   . Hypertension   . Obstructive sleep apnea   . Sleep apnea    uses CPAP    Past Surgical History:  Procedure Laterality Date  . COLONOSCOPY    . WISDOM TOOTH EXTRACTION      Allergies  Allergen Reactions  . Sulfa Antibiotics Rash    rash  . Sulfasalazine Rash    rash    Social History   Socioeconomic History  . Marital status: Unknown    Spouse name: Not on file  . Number of children: Not on file  . Years of education: Not on file  . Highest education level: Not on file  Occupational History  . Not on file  Social Needs  . Financial resource strain: Not on file  . Food insecurity:    Worry: Not on file    Inability: Not on file  . Transportation needs:    Medical: Not on file    Non-medical: Not on file  Tobacco Use  . Smoking status: Never Smoker  . Smokeless tobacco: Never Used  Substance and Sexual Activity  . Alcohol use: Yes    Comment: socially-occ.  . Drug use: No  .  Sexual activity: Not on file  Lifestyle  . Physical activity:    Days per week: Not on file    Minutes per session: Not on file  . Stress: Not on file  Relationships  . Social connections:    Talks on phone: Not on file    Gets together: Not on file    Attends religious service: Not on file    Active member of club or organization: Not on file    Attends meetings of clubs or organizations: Not on file    Relationship status: Not on file  . Intimate partner violence:    Fear of current or ex partner: Not on file    Emotionally abused: Not on file    Physically abused: Not on file    Forced sexual activity: Not on file  Other Topics Concern  . Not on file  Social History Narrative  . Not on file    Family History  Problem Relation Age of Onset  . Heart disease Father   . Allergies Father   . Rheum arthritis Father   .  Hypertension Father   . Crohn's disease Daughter   . Colon cancer Neg Hx   . Pancreatic cancer Neg Hx   . Stomach cancer Neg Hx     Review of Systems Constitutional: negative for anorexia, fevers and sweats  Eyes: negative for irritation, redness and visual disturbance  Ears, nose, mouth, throat, and face: negative for earaches, epistaxis, nasal congestion and sore throat  Respiratory: negative for cough, dyspnea on exertion, sputum and wheezing  Cardiovascular: negative for chest pain, dyspnea, lower extremity edema, orthopnea, palpitations and syncope  Gastrointestinal: negative for abdominal pain, constipation, diarrhea, melena, nausea and vomiting  Genitourinary:negative for dysuria, frequency and hematuria  Hematologic/lymphatic: negative for bleeding, easy bruising and lymphadenopathy  Musculoskeletal:negative for arthralgias, muscle weakness and stiff joints  Neurological: negative for coordination problems, gait problems, headaches and weakness  Endocrine: negative for diabetic symptoms including polydipsia, polyuria and weight loss     Objective:   Physical Exam  Gen. Pleasant, obese, in no distress, normal affect ENT - no pallor,icterus, no post nasal drip, class 2 airway Neck: No JVD, no thyromegaly, no carotid bruits Lungs: no use of accessory muscles, no dullness to percussion, decreased without rales or rhonchi  Cardiovascular: Rhythm regular, heart sounds  normal, no murmurs or gallops, no peripheral edema Abdomen: soft and non-tender, no hepatosplenomegaly, BS normal. Musculoskeletal: No deformities, no cyanosis or clubbing Neuro:  alert, non focal, no tremors       Assessment & Plan:

## 2018-07-04 NOTE — Assessment & Plan Note (Signed)
We discussed measures to push her bedtime earlier, acknowledging that this may be very difficult to fix and there are no adverse health consequences from this.  She does seem to get adequate rest from her 6 to 7 hours of sleep time  Trial of taking melatonin earlier around 9 PM and light exposure for 30 minutes around 8:30 AM to push her bedtime earlier

## 2018-07-04 NOTE — Patient Instructions (Signed)
We will check report on auto settings and tweak if needed.  You have delayed sleep phase -you are a "night owl". Trial of taking melatonin earlier around 9 PM and light exposure for 30 minutes around 8:30 AM to push her bedtime earlier  We will be eligible for a new machine when you are ready

## 2018-07-05 DIAGNOSIS — R300 Dysuria: Secondary | ICD-10-CM | POA: Diagnosis not present

## 2018-07-05 DIAGNOSIS — N390 Urinary tract infection, site not specified: Secondary | ICD-10-CM | POA: Diagnosis not present

## 2018-07-07 ENCOUNTER — Encounter: Payer: Self-pay | Admitting: Pulmonary Disease

## 2018-07-07 ENCOUNTER — Encounter: Payer: Self-pay | Admitting: General Surgery

## 2018-07-20 ENCOUNTER — Telehealth: Payer: Self-pay | Admitting: Pulmonary Disease

## 2018-07-20 DIAGNOSIS — G4733 Obstructive sleep apnea (adult) (pediatric): Secondary | ICD-10-CM

## 2018-07-20 NOTE — Telephone Encounter (Signed)
I reviewed her download . Few residual events on auto 5-12 cm , avg pr 10 cm Please increase CPAP pr slightly to auto 5-14 cm, she should not feel this difference & repeat download in 2-4 weeks  Im happy she is using the machine avg 5h ,although few missed nights

## 2018-07-22 NOTE — Telephone Encounter (Signed)
Call made to patient, made aware of results. Voiced understanding. Order placed to adjust pressures. Nothing further is needed at this time.   Rigoberto Noel, MD 2 days ago     I reviewed her download . Few residual events on auto 5-12 cm , avg pr 10 cm Please increase CPAP pr slightly to auto 5-14 cm, she should not feel this difference & repeat download in 2-4 weeks  Im happy she is using the machine avg 5h ,although few missed nights

## 2018-09-20 ENCOUNTER — Telehealth: Payer: Self-pay

## 2018-09-21 NOTE — Progress Notes (Signed)
Primary Physician/Referring:  Leanna Battles, MD  Patient ID: Frances White, female    DOB: [birth year], 58 y.o.   MRN: [id number]  Chief Complaint  Patient presents with  . Hypertension    abnormal coronary calcium score  . Hyperlipidemia  . Shortness of Breath    HPI: Frances White  is a 58 y.o. female  with Hypertension, hyperlipidemia, prediabetes mellitus, OSA on CPAP referred to me for evaluation of abnormal coronary calcium score and also cardiac risk factor evaluation.  Patient has noticed decreasing exercise tolerance and dyspnea with exertional activity.  This is been ongoing for several months, states that she is able to 2 flat surface walking but does not like to do anything exertional.  She has started to dislike going on hikes with the family as she slows down everyone else.  She has not had any chest discomfort or chest tightness but admits to not being physically active.  Past Medical History:  Diagnosis Date  . Allergy   . Hyperlipidemia   . Hypertension   . OSA (obstructive sleep apnea)   . Pre-diabetes     Past Surgical History:  Procedure Laterality Date  . COLONOSCOPY    . WISDOM TOOTH EXTRACTION      Social History   Socioeconomic History  . Marital status: Unknown    Spouse name: Not on file  . Number of children: Not on file  . Years of education: Not on file  . Highest education level: Not on file  Occupational History  . Not on file  Social Needs  . Financial resource strain: Not on file  . Food insecurity:    Worry: Not on file    Inability: Not on file  . Transportation needs:    Medical: Not on file    Non-medical: Not on file  Tobacco Use  . Smoking status: Never Smoker  . Smokeless tobacco: Never Used  Substance and Sexual Activity  . Alcohol use: Yes    Comment: socially-occ.  . Drug use: No  . Sexual activity: Not on file  Lifestyle  . Physical activity:    Days per week: Not on file    Minutes per session: Not on file  . Stress:  Not on file  Relationships  . Social connections:    Talks on phone: Not on file    Gets together: Not on file    Attends religious service: Not on file    Active member of club or organization: Not on file    Attends meetings of clubs or organizations: Not on file    Relationship status: Not on file  . Intimate partner violence:    Fear of current or ex partner: Not on file    Emotionally abused: Not on file    Physically abused: Not on file    Forced sexual activity: Not on file  Other Topics Concern  . Not on file  Social History Narrative  . Not on file    Current Outpatient Medications on File Prior to Visit  Medication Sig Dispense Refill  . aspirin EC 81 MG tablet Take 81 mg by mouth daily.     . cetirizine (ZYRTEC) 10 MG tablet Take 10 mg by mouth daily.    Marland Kitchen losartan (COZAAR) 100 MG tablet Take 1 tablet by mouth daily.    . Melatonin 5 MG TABS Take 5 mg by mouth at bedtime.     . meloxicam (MOBIC) 15 MG tablet Take 15 mg by mouth  as needed.    . Methen-Hyosc-Meth Blue-Na Phos (ME/NAPHOS/MB/HYO1) 81.6 MG TABS Take 1 tablet by mouth daily as needed.    . mometasone (NASONEX) 50 MCG/ACT nasal spray Place 1 spray into the nose daily as needed.    . Multiple Vitamin (MULTIVITAMIN) tablet Take 1 tablet by mouth daily.    . Multiple Vitamins-Minerals (PRESERVISION AREDS 2) CAPS Take by mouth daily.    . rosuvastatin (CRESTOR) 10 MG tablet Take 10 mg by mouth daily.    . Semaglutide (OZEMPIC, 0.25 OR 0.5 MG/DOSE, [place]) Inject into the skin once a week.    Marland Kitchen VITAMIN D PO Take 2,000 mg by mouth daily.     No current facility-administered medications on file prior to visit.     Review of Systems  Constitution: Positive for malaise/fatigue. Negative for chills, decreased appetite and weight gain.  Cardiovascular: Positive for dyspnea on exertion. Negative for leg swelling and syncope.  Respiratory: Positive for sleep disturbances due to breathing.   Endocrine: Negative for cold  intolerance.  Hematologic/Lymphatic: Does not bruise/bleed easily.  Musculoskeletal: Negative for joint swelling.  Gastrointestinal: Negative for abdominal pain, anorexia, change in bowel habit, hematochezia and melena.  Neurological: Negative for headaches and light-headedness.  Psychiatric/Behavioral: Negative for depression and substance abuse. The patient has insomnia and is nervous/anxious.   All other systems reviewed and are negative.     Objective  Blood pressure 117/83, pulse 97, temperature 97.7 F (36.5 C), height 4' 10.5" (1.486 m), weight 144 lb (65.3 kg), SpO2 97 %. Body mass index is 29.58 kg/m.    Physical Exam  Constitutional: She appears well-nourished. No distress.  Short stature  HENT:  Head: Atraumatic.  Eyes: Conjunctivae are normal.  Neck: Neck supple. No JVD present. No thyromegaly present.  Cardiovascular: Normal rate, regular rhythm, normal heart sounds and intact distal pulses. Exam reveals no gallop.  No murmur heard. Pulmonary/Chest: Effort normal and breath sounds normal.  Abdominal: Soft. Bowel sounds are normal.  Musculoskeletal: Normal range of motion.  Neurological: She is alert.  Skin: Skin is warm and dry.  Psychiatric: She has a normal mood and affect.   Radiology: No results found.  Laboratory examination:   Labs 05/26/2018: Serum glucose 116 mg, BUN 14, creatinine 0.7, eGFR potassium 4.2, ALT minimally elevated at 44, 5-40).  HB 13.3/HCT 42.2, platelets 299.  Total cholesterol 195, triglycerides 134, HDL 51, LDL 117.  Non-HDL cholesterol 142.  A1c 6.4%.  Last TSH was normal on 06/05/2011.   Cardiac Studies:   Coronary calcium score 09/15/2018: Result Impression  IMPRESSION: Moderate calcified coronary artery plaque placing the patient at the 90th percentile for age.  Electronically Signed by: Delma Post  Result Narrative  TECHNIQUE: Thin cut axial acquisition through the heart without contrast. Standard Agatston scoring  algorithm.  INDICATION: Screening  CALCIUM SCORE: 122.  LM: 0. LAD: 90. Cx: 27. RCA: 5 PDA: 0  Cardiac anatomy: Normal heart size, no evidence of pericardial effusion Mediastinum: No adenopathy. Visible abdomen: No significant abnormality. Visible lung fields: Visible portions of the lungs are clear    Assessment   Agatston coronary artery calcium score between 100-199 - Plan: EKG 12-Lead  Hypercholesteremia - Plan: ezetimibe (ZETIA) 10 MG tablet, Lipid Panel With LDL/HDL Ratio, TSH  Essential hypertension - Plan: PCV ECHOCARDIOGRAM COMPLETE, CMP14+EGFR  OSA (obstructive sleep apnea)  Dyspnea on exertion - Plan: PCV MYOCARDIAL PERFUSION WO LEXISCAN, PCV ECHOCARDIOGRAM COMPLETE  Family history of premature CAD  Pre-diabetes - Plan: PCV MYOCARDIAL PERFUSION WO  LEXISCAN  EKG 09/22/2018: Normal sinus rhythm at the rate of 88 bpm, normal axis.  No evidence of ischemia. WITHIN NORMAL LIMITS  Recommendations:   Patient referred to me by  Dr. Janie Morning, for evaluation of decreased exercise tolerance and dyspnea and abnormal coronary calcium score, mostly located in the proximal LAD.  After review of for medical records, labs, we discussed primary prevention, she is at moderate risk for  Significant CAD, will need further evaluation. Schedule for a Exercise Nuclear stress test to evaluate for myocardial ischemia. Will schedule for an echocardiogram.   Although her lipids are much improved On Crestor, I would like to get her LDL to closer to 70.  In view patient being [place], She may not tolerate high-dose of Crestor, we'll add Zetia 10 mg, will obtain follow-up labs in 6-[redacted] weeks along with TSH.  She is also prediabetic, Dr. Philip Aspen had previously discussed with her regarding metformin. I have increased her to go on it as she is on the verge of crying diabetes mellitus.  With regard to hypertension, blood pressure is well controlled.  Physical examination is unremarkable.   She is overweight, she can make dietary changes and can decrease red meat intake and decreasing her weight by 10-15 pounds we'll make her symptoms of dyspnea and exercise tolerance also better.  I would like to see her back in 2 months for follow-up.  Adrian Prows, MD, [facility] 09/24/2018, 8:30 AM [facility] [place] Pager: [phone] Office: [phone] If no answer Cell [phone]

## 2018-09-22 ENCOUNTER — Encounter: Payer: Self-pay | Admitting: Cardiology

## 2018-09-22 ENCOUNTER — Ambulatory Visit (INDEPENDENT_AMBULATORY_CARE_PROVIDER_SITE_OTHER): Payer: BC Managed Care – PPO | Admitting: Cardiology

## 2018-09-22 ENCOUNTER — Other Ambulatory Visit: Payer: Self-pay

## 2018-09-22 VITALS — BP 117/83 | HR 97 | Temp 97.7°F | Ht 58.5 in | Wt 144.0 lb

## 2018-09-22 DIAGNOSIS — R7303 Prediabetes: Secondary | ICD-10-CM

## 2018-09-22 DIAGNOSIS — E7800 Pure hypercholesterolemia, unspecified: Secondary | ICD-10-CM | POA: Diagnosis not present

## 2018-09-22 DIAGNOSIS — R931 Abnormal findings on diagnostic imaging of heart and coronary circulation: Secondary | ICD-10-CM | POA: Diagnosis not present

## 2018-09-22 DIAGNOSIS — G4733 Obstructive sleep apnea (adult) (pediatric): Secondary | ICD-10-CM | POA: Diagnosis not present

## 2018-09-22 DIAGNOSIS — I10 Essential (primary) hypertension: Secondary | ICD-10-CM

## 2018-09-22 DIAGNOSIS — R0600 Dyspnea, unspecified: Secondary | ICD-10-CM

## 2018-09-22 DIAGNOSIS — Z8249 Family history of ischemic heart disease and other diseases of the circulatory system: Secondary | ICD-10-CM

## 2018-09-22 DIAGNOSIS — R0609 Other forms of dyspnea: Secondary | ICD-10-CM

## 2018-09-22 MED ORDER — EZETIMIBE 10 MG PO TABS: 10.0000 mg | ORAL_TABLET | Freq: Every day | ORAL | 2 refills | Status: DC

## 2018-10-07 ENCOUNTER — Ambulatory Visit (INDEPENDENT_AMBULATORY_CARE_PROVIDER_SITE_OTHER): Payer: BC Managed Care – PPO

## 2018-10-07 ENCOUNTER — Other Ambulatory Visit: Payer: Self-pay

## 2018-10-07 DIAGNOSIS — R0600 Dyspnea, unspecified: Secondary | ICD-10-CM

## 2018-10-07 DIAGNOSIS — R7303 Prediabetes: Secondary | ICD-10-CM | POA: Diagnosis not present

## 2018-10-07 DIAGNOSIS — R0609 Other forms of dyspnea: Secondary | ICD-10-CM | POA: Diagnosis not present

## 2018-10-14 DIAGNOSIS — E7849 Other hyperlipidemia: Secondary | ICD-10-CM | POA: Diagnosis not present

## 2018-10-28 ENCOUNTER — Ambulatory Visit (INDEPENDENT_AMBULATORY_CARE_PROVIDER_SITE_OTHER): Payer: BC Managed Care – PPO

## 2018-10-28 ENCOUNTER — Other Ambulatory Visit: Payer: Self-pay

## 2018-10-28 DIAGNOSIS — R0600 Dyspnea, unspecified: Secondary | ICD-10-CM

## 2018-10-28 DIAGNOSIS — I10 Essential (primary) hypertension: Secondary | ICD-10-CM | POA: Diagnosis not present

## 2018-10-28 DIAGNOSIS — R0609 Other forms of dyspnea: Secondary | ICD-10-CM

## 2018-11-28 DIAGNOSIS — I10 Essential (primary) hypertension: Secondary | ICD-10-CM | POA: Diagnosis not present

## 2018-11-28 DIAGNOSIS — E7849 Other hyperlipidemia: Secondary | ICD-10-CM | POA: Diagnosis not present

## 2018-12-01 ENCOUNTER — Other Ambulatory Visit: Payer: Self-pay

## 2018-12-01 ENCOUNTER — Encounter: Payer: Self-pay | Admitting: Cardiology

## 2018-12-01 ENCOUNTER — Ambulatory Visit: Payer: BC Managed Care – PPO | Admitting: Cardiology

## 2018-12-01 VITALS — Ht 59.0 in | Wt 143.0 lb

## 2018-12-01 DIAGNOSIS — I10 Essential (primary) hypertension: Secondary | ICD-10-CM | POA: Diagnosis not present

## 2018-12-01 DIAGNOSIS — E7800 Pure hypercholesterolemia, unspecified: Secondary | ICD-10-CM

## 2018-12-01 DIAGNOSIS — R0609 Other forms of dyspnea: Secondary | ICD-10-CM | POA: Diagnosis not present

## 2018-12-01 DIAGNOSIS — R931 Abnormal findings on diagnostic imaging of heart and coronary circulation: Secondary | ICD-10-CM

## 2018-12-01 DIAGNOSIS — R0600 Dyspnea, unspecified: Secondary | ICD-10-CM

## 2018-12-01 NOTE — Progress Notes (Signed)
Primary Physician/Referring:  Leanna Battles, MD  Patient ID: Frances White, female    DOB: 1961-04-28, 58 y.o.   MRN: [id number]  Virtual Visit via Video Note: This visit type was conducted due to national recommendations for restrictions regarding the COVID-19 Pandemic (e.g. social distancing).  This format is felt to be most appropriate for this patient at this time.  All issues noted in this document were discussed and addressed.  No physical exam was performed (except for noted visual exam findings with Telehealth visits).  The patient has consented to conduct a Telehealth visit and understands insurance will be billed.   I connected with@, on 12/01/18 at  by a video enabled telemedicine application and verified that I am speaking with the correct person using two identifiers.   I discussed the limitations of evaluation and management by telemedicine and the availability of in person appointments. The patient expressed understanding and agreed to proceed.   I have discussed with patient regarding the safety during COVID Pandemic and steps and precautions to be taken including social distancing, frequent hand wash and use of detergent soap, gels with the patient. I asked the patient to avoid touching mouth, nose, eyes, ears with the hands. I encouraged regular walking around the neighborhood and exercise and regular diet, as long as social distancing can be maintained.   Chief Complaint  Patient presents with   Shortness of Breath   Hypertension   Follow-up    HPI: Frances White  is a 58 y.o. female  with Hypertension, hyperlipidemia, prediabetes mellitus, OSA on CPAP presents for f/u evaluation of abnormal coronary calcium score and also cardiac risk factor evaluation.  Due to reduced exercise tolerance, she underwent stress testing and echo which she is aware of the results and since then has started to exercise regularly. No other specific complaints.   Past Medical History:  Diagnosis  Date   Allergy    Hyperlipidemia    Hypertension    OSA (obstructive sleep apnea)    Pre-diabetes     Past Surgical History:  Procedure Laterality Date   COLONOSCOPY     WISDOM TOOTH EXTRACTION      Social History   Socioeconomic History   Marital status: Married    Spouse name: Not on file   Number of children: 2   Years of education: Not on file   Highest education level: Not on file  Occupational History   Not on file  Social Needs   Financial resource strain: Not on file   Food insecurity    Worry: Not on file    Inability: Not on file   Transportation needs    Medical: Not on file    Non-medical: Not on file  Tobacco Use   Smoking status: Never Smoker   Smokeless tobacco: Never Used  Substance and Sexual Activity   Alcohol use: Yes    Comment: socially-occ.   Drug use: No   Sexual activity: Not on file  Lifestyle   Physical activity    Days per week: Not on file    Minutes per session: Not on file   Stress: Not on file  Relationships   Social connections    Talks on phone: Not on file    Gets together: Not on file    Attends religious service: Not on file    Active member of club or organization: Not on file    Attends meetings of clubs or organizations: Not on file    Relationship  status: Not on file   Intimate partner violence    Fear of current or ex partner: Not on file    Emotionally abused: Not on file    Physically abused: Not on file    Forced sexual activity: Not on file  Other Topics Concern   Not on file  Social History Narrative   Not on file    Current Outpatient Medications on File Prior to Visit  Medication Sig Dispense Refill   aspirin EC 81 MG tablet Take 81 mg by mouth daily.      cetirizine (ZYRTEC) 10 MG tablet Take 10 mg by mouth daily.     ezetimibe (ZETIA) 10 MG tablet Take 1 tablet (10 mg total) by mouth daily. 30 tablet 2   losartan (COZAAR) 100 MG tablet Take 1 tablet by mouth daily.       Melatonin 5 MG TABS Take 5 mg by mouth at bedtime.      meloxicam (MOBIC) 15 MG tablet Take 15 mg by mouth as needed.     Methen-Hyosc-Meth Blue-Na Phos (ME/NAPHOS/MB/HYO1) 81.6 MG TABS Take 1 tablet by mouth daily as needed.     mometasone (NASONEX) 50 MCG/ACT nasal spray Place 1 spray into the nose daily as needed.     Multiple Vitamin (MULTIVITAMIN) tablet Take 1 tablet by mouth daily.     Multiple Vitamins-Minerals (PRESERVISION AREDS 2) CAPS Take by mouth daily.     rosuvastatin (CRESTOR) 10 MG tablet Take 10 mg by mouth daily.     SEMAGLUTIDE PO Take 9 mg by mouth daily.     VITAMIN D PO Take 2,000 mg by mouth daily.     No current facility-administered medications on file prior to visit.    Review of Systems  Constitution: Positive for malaise/fatigue. Negative for chills, decreased appetite and weight gain.  Cardiovascular: Positive for dyspnea on exertion. Negative for leg swelling and syncope.  Respiratory: Positive for sleep disturbances due to breathing.   Endocrine: Negative for cold intolerance.  Hematologic/Lymphatic: Does not bruise/bleed easily.  Musculoskeletal: Negative for joint swelling.  Gastrointestinal: Negative for abdominal pain, anorexia, change in bowel habit, hematochezia and melena.  Neurological: Negative for headaches and light-headedness.  Psychiatric/Behavioral: Negative for depression and substance abuse. The patient has insomnia and is nervous/anxious.   All other systems reviewed and are negative.  Objective  Height 4' 11"  (1.499 m), weight 143 lb (64.9 kg). Body mass index is 28.88 kg/m.   Physical exam not performed or limited due to virtual visit.  Patient appeared to be in no distress, Neck was supple, respiration was not labored.  Please see exam details from prior visit is as below.  Physical Exam  Constitutional: She appears well-nourished. No distress.  Short stature  HENT:  Head: Atraumatic.  Eyes: Conjunctivae are normal.   Neck: Neck supple. No JVD present. No thyromegaly present.  Cardiovascular: Normal rate, regular rhythm, normal heart sounds and intact distal pulses. Exam reveals no gallop.  No murmur heard. Pulmonary/Chest: Effort normal and breath sounds normal.  Abdominal: Soft. Bowel sounds are normal.  Musculoskeletal: Normal range of motion.  Neurological: She is alert.  Skin: Skin is warm and dry.  Psychiatric: She has a normal mood and affect.   Laboratory examination:   Labs 11/28/18/20: Serum glucose 90 mg, BUN 11, creatinine 0.8, eGFR greater than 60 mL, potassium 4.4,  Total cholesterol 170, triglycerides 102, HDL 53, LDL 97.  Non-HDL cholesterol 117.  10/14/2018: A1c 5.2%, serum glucose 95 mg, BUN  9, creatinine 0.7, eGFR greater than 61, potassium 4.2, CMP normal.   Total cholesterol 164, triglycerides 181, HDL 48, LDL 80.  Non-HDL cholesterol 116.  Labs 05/26/2018: Serum glucose 116 mg, BUN 14, creatinine 0.7, eGFR potassium 4.2, ALT minimally elevated at 44, 5-40).  HB 13.3/HCT 42.2, platelets 299.  Total cholesterol 195, triglycerides 134, HDL 51, LDL 117.  Non-HDL cholesterol 142.  A1c 6.4%.  Last TSH was normal on 06/05/2011.  Cardiac Studies:   Coronary calcium score 09/15/2018: Result Impression  IMPRESSION: Moderate calcified coronary artery plaque placing the patient at the 90th percentile for age.  Electronically Signed by: Delma Post  Result Narrative  CALCIUM SCORE: 122.  LM: 0. LAD: 90. Cx: 27. RCA: 5 PDA: 0  Cardiac anatomy: Normal heart size, no evidence of pericardial effusion Mediastinum: No adenopathy. Visible abdomen: No significant abnormality. Visible lung fields: Visible portions of the lungs are clear   Echocardiogram 10/28/2018 :  Normal LV systolic function with EF 57%. Left ventricle cavity is normal in size. Normal global wall motion. Doppler evidence of grade II (pseudonormal) diastolic dysfunction. Diastolic dysfunction findings suggests elevated  LA/LV end diastolic pressure. Calculated EF 57%. Structurally normal appearing tricuspid valve. Mild tricuspid regurgitation. No evidence of pulmonary hypertension.  Exercise Sestamibi stress test 10/07/2018: Exercise treadmill stress test was performed using Bruce protocol. Patient walked 7:46 min, achieving 9.7 METS, and maximum heart rate of 140 beats per minute, which is 85% of maximum predicted heart rate. Hemodynamic response was normal. Exercise capacity was normal.  Myocardial pefusion imaging is normal. Left ventricular ejection fraction is  66% with normal wall motion. Low risk study.   Assessment     ICD-10-CM   1. Agatston coronary artery calcium score between 100-199  R93.1   2. Hypercholesteremia  E78.00   3. Essential hypertension  I10   4. Dyspnea on exertion  R06.09     EKG 09/22/2018: Normal sinus rhythm at the rate of 88 bpm, normal axis.  No evidence of ischemia. WITHIN NORMAL LIMITS  Recommendations:    Reviewed her labs, stress test again with the patient, her reduced exercise tolerance may be related to deconditioning and grade 2 diastolic dysfunction.  Blood pressure is well controlled per history and also reviewed the labs with regard to lipids, since starting Crestor/Zetia combination, non-HDL cholesterol is at goal, she is also making lifestyle changes.  Hence I'll recommend continuing present medical therapy with present dose of medications.  I discussed with her regarding diastolic dysfunction and increased her to increase her physical activity as tolerated and to push himself to improve aerobic tolerance.  She was pleased with explanation, I'll see her back in 6 months for follow-up to improve compliance with regard to exercise, weight loss and dietary modification.  Adrian Prows, MD, [facility] 12/01/2018, 8:28 PM [facility] [place] Pager: [phone] Office: [phone] If no answer Cell [phone]

## 2018-12-21 ENCOUNTER — Other Ambulatory Visit: Payer: Self-pay | Admitting: Cardiology

## 2018-12-21 DIAGNOSIS — E7800 Pure hypercholesterolemia, unspecified: Secondary | ICD-10-CM

## 2019-05-02 DIAGNOSIS — Z20828 Contact with and (suspected) exposure to other viral communicable diseases: Secondary | ICD-10-CM | POA: Diagnosis not present

## 2019-06-08 DIAGNOSIS — I10 Essential (primary) hypertension: Secondary | ICD-10-CM | POA: Diagnosis not present

## 2019-06-08 DIAGNOSIS — Z0000 Encounter for general adult medical examination without abnormal findings: Secondary | ICD-10-CM | POA: Diagnosis not present

## 2019-06-08 DIAGNOSIS — R739 Hyperglycemia, unspecified: Secondary | ICD-10-CM | POA: Diagnosis not present

## 2019-06-08 DIAGNOSIS — Z20828 Contact with and (suspected) exposure to other viral communicable diseases: Secondary | ICD-10-CM | POA: Diagnosis not present

## 2019-06-09 ENCOUNTER — Ambulatory Visit: Payer: BC Managed Care – PPO | Admitting: Cardiology

## 2019-06-15 DIAGNOSIS — Z1331 Encounter for screening for depression: Secondary | ICD-10-CM | POA: Diagnosis not present

## 2019-06-15 DIAGNOSIS — R634 Abnormal weight loss: Secondary | ICD-10-CM | POA: Diagnosis not present

## 2019-06-15 DIAGNOSIS — R739 Hyperglycemia, unspecified: Secondary | ICD-10-CM | POA: Diagnosis not present

## 2019-06-15 DIAGNOSIS — E785 Hyperlipidemia, unspecified: Secondary | ICD-10-CM | POA: Diagnosis not present

## 2019-06-15 DIAGNOSIS — I10 Essential (primary) hypertension: Secondary | ICD-10-CM | POA: Diagnosis not present

## 2019-09-11 DIAGNOSIS — H353131 Nonexudative age-related macular degeneration, bilateral, early dry stage: Secondary | ICD-10-CM | POA: Diagnosis not present

## 2019-09-11 DIAGNOSIS — H2513 Age-related nuclear cataract, bilateral: Secondary | ICD-10-CM | POA: Diagnosis not present

## 2019-09-11 DIAGNOSIS — H524 Presbyopia: Secondary | ICD-10-CM | POA: Diagnosis not present

## 2019-09-11 DIAGNOSIS — H5213 Myopia, bilateral: Secondary | ICD-10-CM | POA: Diagnosis not present

## 2019-12-04 ENCOUNTER — Ambulatory Visit: Payer: BC Managed Care – PPO | Admitting: Cardiology

## 2019-12-19 DIAGNOSIS — U071 COVID-19: Secondary | ICD-10-CM | POA: Diagnosis not present

## 2019-12-19 DIAGNOSIS — Z1152 Encounter for screening for COVID-19: Secondary | ICD-10-CM | POA: Diagnosis not present

## 2020-03-22 DIAGNOSIS — Z23 Encounter for immunization: Secondary | ICD-10-CM | POA: Diagnosis not present

## 2020-06-17 DIAGNOSIS — Z0000 Encounter for general adult medical examination without abnormal findings: Secondary | ICD-10-CM | POA: Diagnosis not present

## 2020-06-17 DIAGNOSIS — E785 Hyperlipidemia, unspecified: Secondary | ICD-10-CM | POA: Diagnosis not present

## 2020-06-17 DIAGNOSIS — R739 Hyperglycemia, unspecified: Secondary | ICD-10-CM | POA: Diagnosis not present

## 2020-06-20 DIAGNOSIS — Z0000 Encounter for general adult medical examination without abnormal findings: Secondary | ICD-10-CM | POA: Diagnosis not present

## 2020-06-20 DIAGNOSIS — R82998 Other abnormal findings in urine: Secondary | ICD-10-CM | POA: Diagnosis not present

## 2020-06-20 DIAGNOSIS — R739 Hyperglycemia, unspecified: Secondary | ICD-10-CM | POA: Diagnosis not present

## 2020-06-20 DIAGNOSIS — I10 Essential (primary) hypertension: Secondary | ICD-10-CM | POA: Diagnosis not present

## 2020-07-03 DIAGNOSIS — Z20828 Contact with and (suspected) exposure to other viral communicable diseases: Secondary | ICD-10-CM | POA: Diagnosis not present

## 2020-09-27 DIAGNOSIS — H524 Presbyopia: Secondary | ICD-10-CM | POA: Diagnosis not present

## 2020-09-27 DIAGNOSIS — H2513 Age-related nuclear cataract, bilateral: Secondary | ICD-10-CM | POA: Diagnosis not present

## 2020-09-27 DIAGNOSIS — H5213 Myopia, bilateral: Secondary | ICD-10-CM | POA: Diagnosis not present

## 2020-09-27 DIAGNOSIS — R7303 Prediabetes: Secondary | ICD-10-CM | POA: Diagnosis not present

## 2020-11-19 ENCOUNTER — Other Ambulatory Visit: Payer: Self-pay | Admitting: Internal Medicine

## 2020-11-19 DIAGNOSIS — Z1231 Encounter for screening mammogram for malignant neoplasm of breast: Secondary | ICD-10-CM

## 2020-12-06 ENCOUNTER — Other Ambulatory Visit: Payer: Self-pay

## 2020-12-06 ENCOUNTER — Ambulatory Visit
Admission: RE | Admit: 2020-12-06 | Discharge: 2020-12-06 | Disposition: A | Discharge status: Home / Self Care | Payer: BLUE CROSS/BLUE SHIELD | Source: Ambulatory Visit | Attending: Internal Medicine | Admitting: Internal Medicine

## 2020-12-06 DIAGNOSIS — Z1231 Encounter for screening mammogram for malignant neoplasm of breast: Secondary | ICD-10-CM | POA: Diagnosis not present

## 2021-02-19 DIAGNOSIS — Z23 Encounter for immunization: Secondary | ICD-10-CM | POA: Diagnosis not present

## 2021-03-24 DIAGNOSIS — Z23 Encounter for immunization: Secondary | ICD-10-CM | POA: Diagnosis not present

## 2021-07-29 DIAGNOSIS — R739 Hyperglycemia, unspecified: Secondary | ICD-10-CM | POA: Diagnosis not present

## 2021-07-29 DIAGNOSIS — E785 Hyperlipidemia, unspecified: Secondary | ICD-10-CM | POA: Diagnosis not present

## 2021-07-31 DIAGNOSIS — I10 Essential (primary) hypertension: Secondary | ICD-10-CM | POA: Diagnosis not present

## 2021-07-31 DIAGNOSIS — Z0000 Encounter for general adult medical examination without abnormal findings: Secondary | ICD-10-CM | POA: Diagnosis not present

## 2021-07-31 DIAGNOSIS — Z1331 Encounter for screening for depression: Secondary | ICD-10-CM | POA: Diagnosis not present

## 2021-07-31 DIAGNOSIS — R739 Hyperglycemia, unspecified: Secondary | ICD-10-CM | POA: Diagnosis not present

## 2021-08-26 ENCOUNTER — Telehealth: Payer: Self-pay | Admitting: Pulmonary Disease

## 2021-08-26 DIAGNOSIS — G4733 Obstructive sleep apnea (adult) (pediatric): Secondary | ICD-10-CM

## 2021-08-26 NOTE — Telephone Encounter (Signed)
I have called the patient and was unable to leave a message but she would need to be a new patient. Please call to make a sleep consult with a nurse practitioner.  ?

## 2021-08-29 NOTE — Telephone Encounter (Signed)
Dr. Elsworth Soho, please advise on this. ?

## 2021-08-29 NOTE — Telephone Encounter (Signed)
Called and spoke with patient. She is aware that the order will be placed. She does not have a preference of the DME company.  ? ?Order has been placed.  ? ?Nothing further needed at time of call.  ?

## 2021-08-29 NOTE — Telephone Encounter (Signed)
Contacted pt and scheduled for first available with RA (pt did not want to see anyone else) on 6/1. Pt wants to now if RA would "please, please, please" give her a new prescription for a CPAP before then as she is fearful she is inhesting mold with the recalled one. States he can "fiddle with the settings later as she just wants the new machine." ?

## 2021-09-18 DIAGNOSIS — G4733 Obstructive sleep apnea (adult) (pediatric): Secondary | ICD-10-CM | POA: Diagnosis not present

## 2021-09-30 DIAGNOSIS — H353131 Nonexudative age-related macular degeneration, bilateral, early dry stage: Secondary | ICD-10-CM | POA: Diagnosis not present

## 2021-09-30 DIAGNOSIS — R7303 Prediabetes: Secondary | ICD-10-CM | POA: Diagnosis not present

## 2021-09-30 DIAGNOSIS — H5213 Myopia, bilateral: Secondary | ICD-10-CM | POA: Diagnosis not present

## 2021-09-30 DIAGNOSIS — H2513 Age-related nuclear cataract, bilateral: Secondary | ICD-10-CM | POA: Diagnosis not present

## 2021-10-09 ENCOUNTER — Ambulatory Visit (INDEPENDENT_AMBULATORY_CARE_PROVIDER_SITE_OTHER): Payer: BC Managed Care – PPO | Admitting: Pulmonary Disease

## 2021-10-09 ENCOUNTER — Encounter: Payer: Self-pay | Admitting: Pulmonary Disease

## 2021-10-09 DIAGNOSIS — G4733 Obstructive sleep apnea (adult) (pediatric): Secondary | ICD-10-CM | POA: Diagnosis not present

## 2021-10-09 DIAGNOSIS — G4721 Circadian rhythm sleep disorder, delayed sleep phase type: Secondary | ICD-10-CM | POA: Diagnosis not present

## 2021-10-09 NOTE — Patient Instructions (Signed)
  X CPAP is working well on auto settings  We discussed issues with doxepin

## 2021-10-09 NOTE — Assessment & Plan Note (Signed)
CPAP download was reviewed which shows excellent control of events on auto settings with average pressure of 11 and max pressure of 12 cm.  Leak is minimal.  She is very compliant since obtaining a new machine, average usage about 5.5 hours per night. CPAP is only helped improve her daytime somnolence and fatigue  We discussed alternatives including dental appliance and hypoglossal nerve implant

## 2021-10-09 NOTE — Assessment & Plan Note (Signed)
PCP has her on doxepin which has helped her anchor her bedtime to between 10 and 11 PM.  We did discuss the possibility of tachyphylaxis and needing higher dose of medications.  She will try nights off and substituting with melatonin instead. We discussed alternatives including trazodone and Ambien. We will tackle this again next year

## 2021-10-09 NOTE — Progress Notes (Signed)
Subjective:    Patient ID: Frances White, female    DOB: August 11, 1960, 61 y.o.   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 She was diagnosed in 2014 with severe OSA especially in the supine position.  Initially was maintained on CPAP of 8 cm with nasal pillows and subsequently due to persistent snoring change to auto CPAP 5 to 12 cm.  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  PCP has prescribed doxepin which she has been using for a few months now with good results  PMH -hypertension, prediabetes  Bedtime is between 10 and 11 PM, she takes doxepin about an hour prior to bedtime, sleep latency is minimal with this medication, she reports 1-2 nocturnal awakenings and is out of bed around 8 AM feeling rested without dryness of mouth or headaches. Epworth sleepiness score was 5  There is no history suggestive of cataplexy, sleep paralysis or parasomnias    Significant tests/ events reviewed  NPSG 06/2012 - 149lbs -  AHI 41/h , lowest 73 %, mostly supine >> CPAP 8 cm , small FF mask    Past Medical History:  Diagnosis Date   Allergy    Hyperlipidemia    Hypertension    OSA (obstructive sleep apnea)    Pre-diabetes     Past Surgical History:  Procedure Laterality Date   COLONOSCOPY     WISDOM TOOTH EXTRACTION      Allergies  Allergen Reactions   Sulfa Antibiotics Rash    rash   Sulfasalazine Rash    rash    . Social History   Socioeconomic History   Marital status: Married    Spouse name: Not on file   Number of children: 2   Years of education: Not on file   Highest education level: Not on file  Occupational History   Not on file  Tobacco Use   Smoking status: Never   Smokeless tobacco: Never  Vaping Use   Vaping Use: Never used   Substance and Sexual Activity   Alcohol use: Yes    Comment: socially-occ.   Drug use: No   Sexual activity: Not on file  Other Topics Concern   Not on file  Social History Narrative   Not on file   Social Determinants of Health   Financial Resource Strain: Not on file  Food Insecurity: Not on file  Transportation Needs: Not on file  Physical Activity: Not on file  Stress: Not on file  Social Connections: Not on file  Intimate Partner Violence: Not on file      Family History  Problem Relation Age of Onset   Hypertension Mother    Heart disease Father    Allergies Father    Rheum arthritis Father    Hypertension Father    Crohn's disease Daughter    Colon cancer Neg Hx    Pancreatic cancer Neg Hx    Stomach cancer Neg Hx    Breast cancer Neg Hx      Review of Systems neg for any significant sore throat, dysphagia, itching, sneezing, nasal congestion or excess/ purulent secretions, fever, chills, sweats, unintended wt loss, pleuritic or exertional cp, hempoptysis, orthopnea pnd or change in chronic leg swelling. Also denies presyncope, palpitations, heartburn, abdominal  pain, nausea, vomiting, diarrhea or change in bowel or urinary habits, dysuria,hematuria, rash, arthralgias, visual complaints, headache, numbness weakness or ataxia.     Objective:   Physical Exam   Gen. Pleasant, well-nourished, in no distress, normal affect ENT - no pallor,icterus, no post nasal drip, class 2 airway Neck: No JVD, no thyromegaly, no carotid bruits Lungs: no use of accessory muscles, no dullness to percussion, clear without rales or rhonchi  Cardiovascular: Rhythm regular, heart sounds  normal, no murmurs or gallops, no peripheral edema Abdomen: soft and non-tender, no hepatosplenomegaly, BS normal. Musculoskeletal: No deformities, no cyanosis or clubbing Neuro:  alert, non focal        Assessment & Plan:

## 2021-10-19 DIAGNOSIS — G4733 Obstructive sleep apnea (adult) (pediatric): Secondary | ICD-10-CM | POA: Diagnosis not present

## 2021-11-18 DIAGNOSIS — G4733 Obstructive sleep apnea (adult) (pediatric): Secondary | ICD-10-CM | POA: Diagnosis not present

## 2021-12-19 ENCOUNTER — Other Ambulatory Visit: Payer: Self-pay | Admitting: Internal Medicine

## 2021-12-19 DIAGNOSIS — G4733 Obstructive sleep apnea (adult) (pediatric): Secondary | ICD-10-CM | POA: Diagnosis not present

## 2021-12-19 DIAGNOSIS — Z1231 Encounter for screening mammogram for malignant neoplasm of breast: Secondary | ICD-10-CM

## 2021-12-26 ENCOUNTER — Ambulatory Visit
Admission: RE | Admit: 2021-12-26 | Discharge: 2021-12-26 | Disposition: A | Discharge status: Home / Self Care | Payer: BC Managed Care – PPO | Source: Ambulatory Visit | Attending: Internal Medicine | Admitting: Internal Medicine

## 2021-12-26 DIAGNOSIS — Z1231 Encounter for screening mammogram for malignant neoplasm of breast: Secondary | ICD-10-CM

## 2022-02-04 DIAGNOSIS — N390 Urinary tract infection, site not specified: Secondary | ICD-10-CM | POA: Diagnosis not present

## 2022-02-04 DIAGNOSIS — Z23 Encounter for immunization: Secondary | ICD-10-CM | POA: Diagnosis not present

## 2022-03-03 ENCOUNTER — Encounter: Payer: Self-pay | Admitting: Internal Medicine

## 2022-07-30 DIAGNOSIS — E785 Hyperlipidemia, unspecified: Secondary | ICD-10-CM | POA: Diagnosis not present

## 2022-07-30 DIAGNOSIS — K219 Gastro-esophageal reflux disease without esophagitis: Secondary | ICD-10-CM | POA: Diagnosis not present

## 2022-07-30 DIAGNOSIS — R7989 Other specified abnormal findings of blood chemistry: Secondary | ICD-10-CM | POA: Diagnosis not present

## 2022-07-30 DIAGNOSIS — R739 Hyperglycemia, unspecified: Secondary | ICD-10-CM | POA: Diagnosis not present

## 2022-07-30 DIAGNOSIS — I10 Essential (primary) hypertension: Secondary | ICD-10-CM | POA: Diagnosis not present

## 2022-10-14 IMAGING — MG MM DIGITAL SCREENING BILAT W/ TOMO AND CAD
8 series · 9 of 24 positions shown · non-contrast
Comparison: Previous exam(s).

CLINICAL DATA: Screening.

EXAM:
DIGITAL SCREENING BILATERAL MAMMOGRAM WITH TOMOSYNTHESIS AND CAD
TECHNIQUE: Bilateral screening digital craniocaudal and mediolateral oblique
mammograms were obtained. Bilateral screening digital breast
tomosynthesis was performed. The images were evaluated with
computer-aided detection.

[L CC synth-2D]
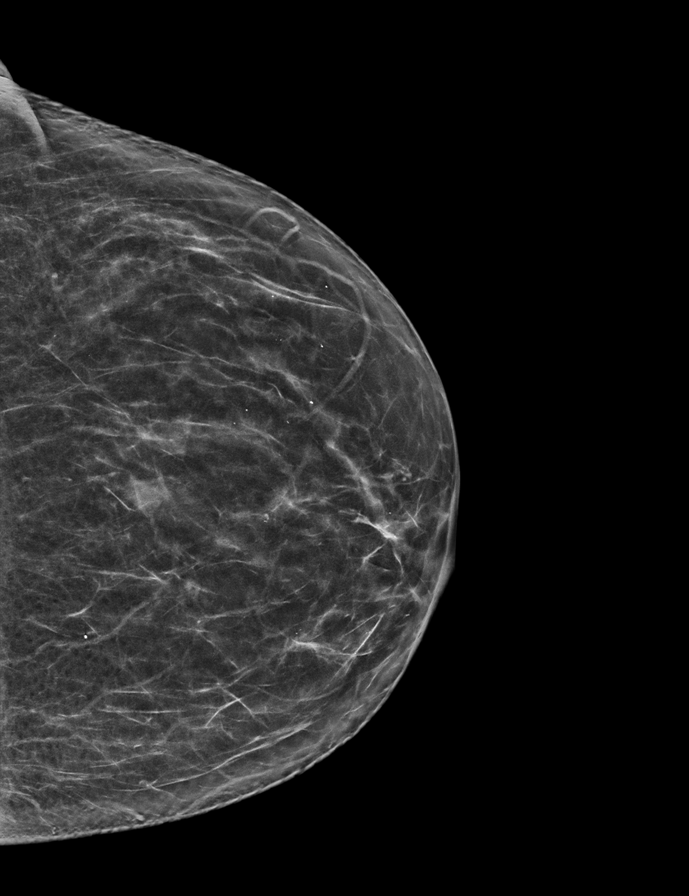

[L MLO synth-2D]
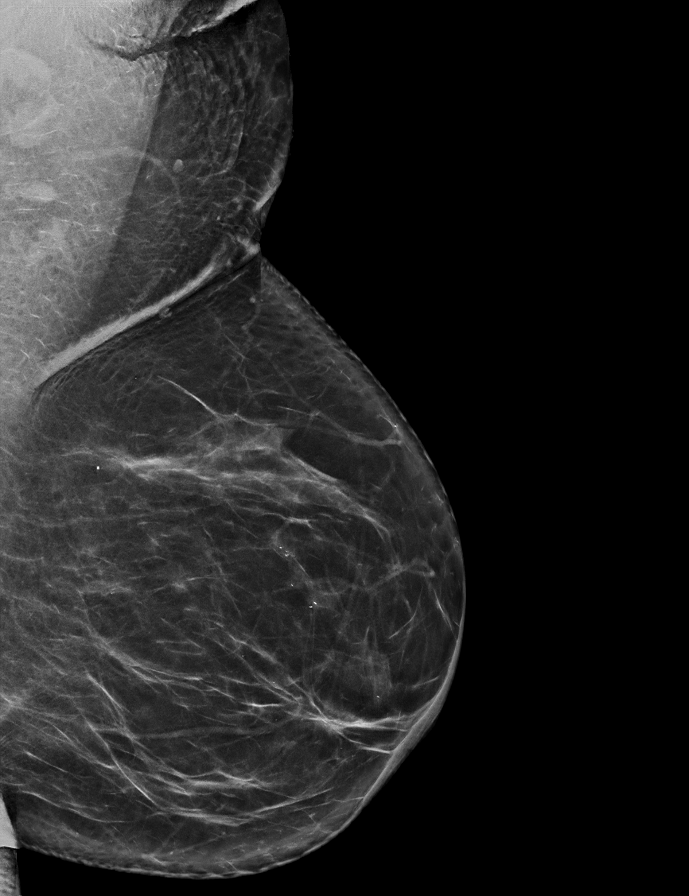

[R MLO synth-2D]
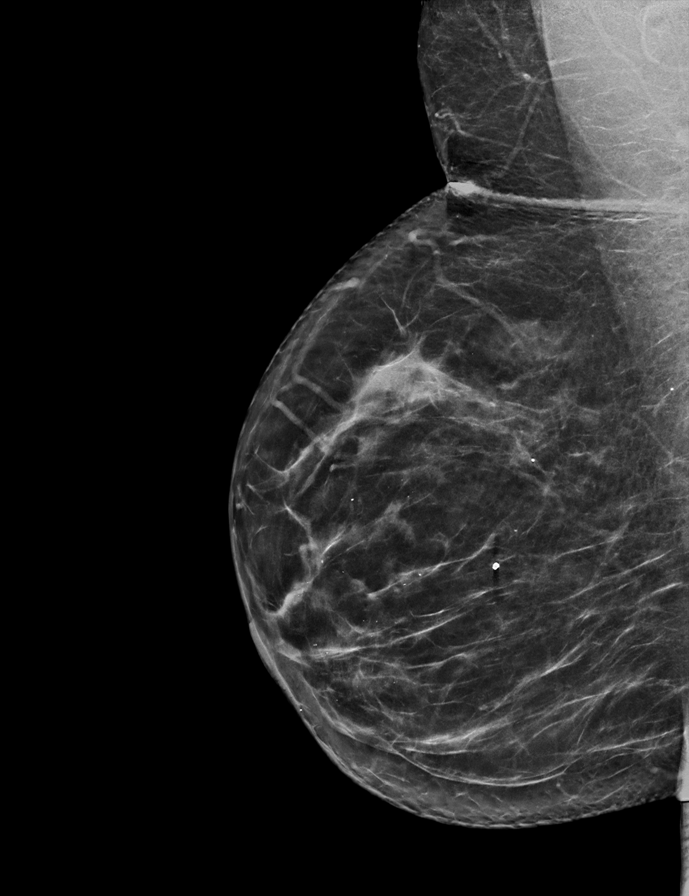

[R CC synth-2D]
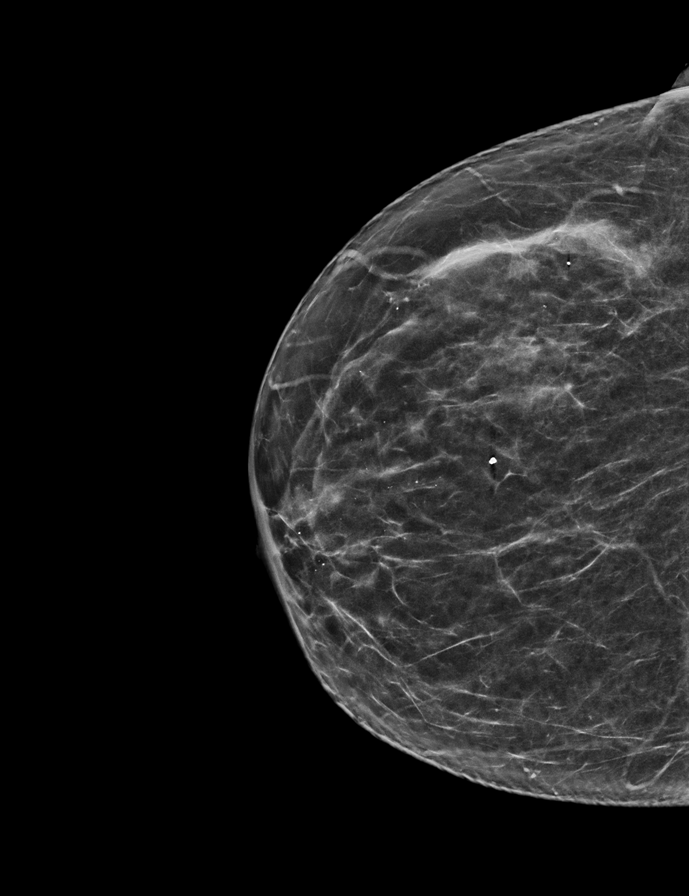

[R MLO tomo · 2 of 70 frames shown]
[frame 23/70]
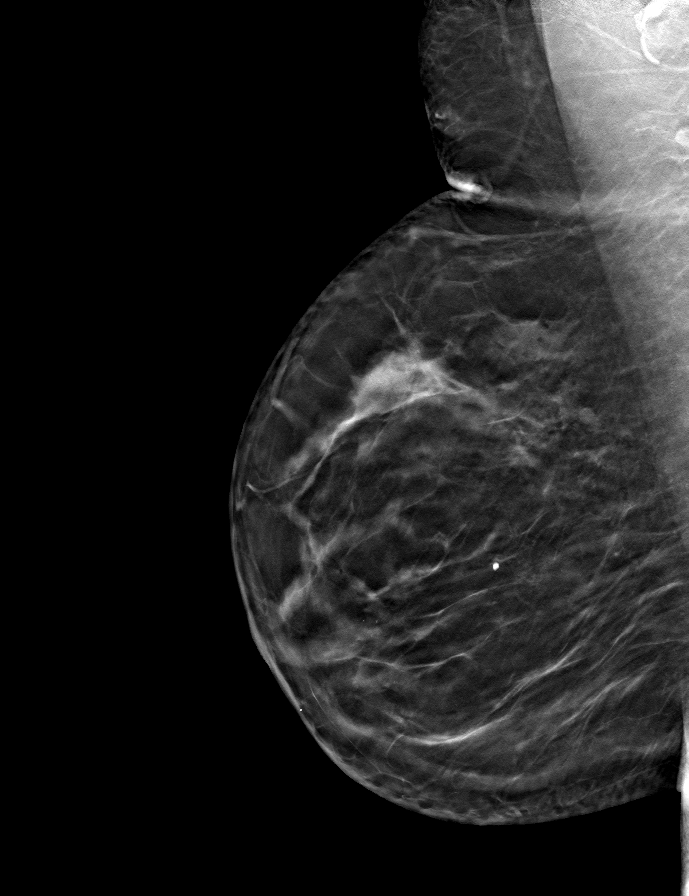
[frame 35/70]
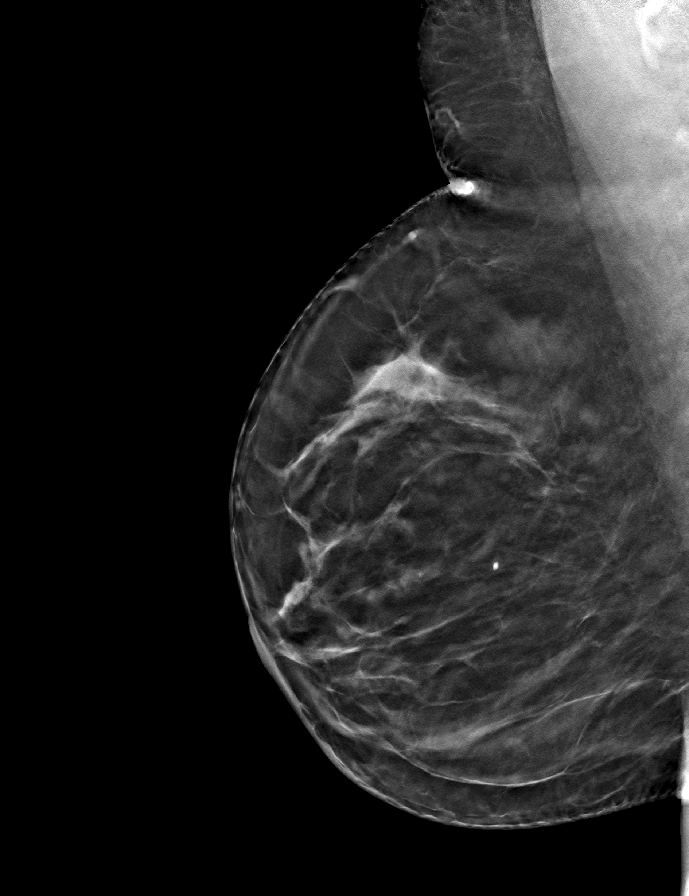

[R CC tomo · tomo slice 32/63.0]
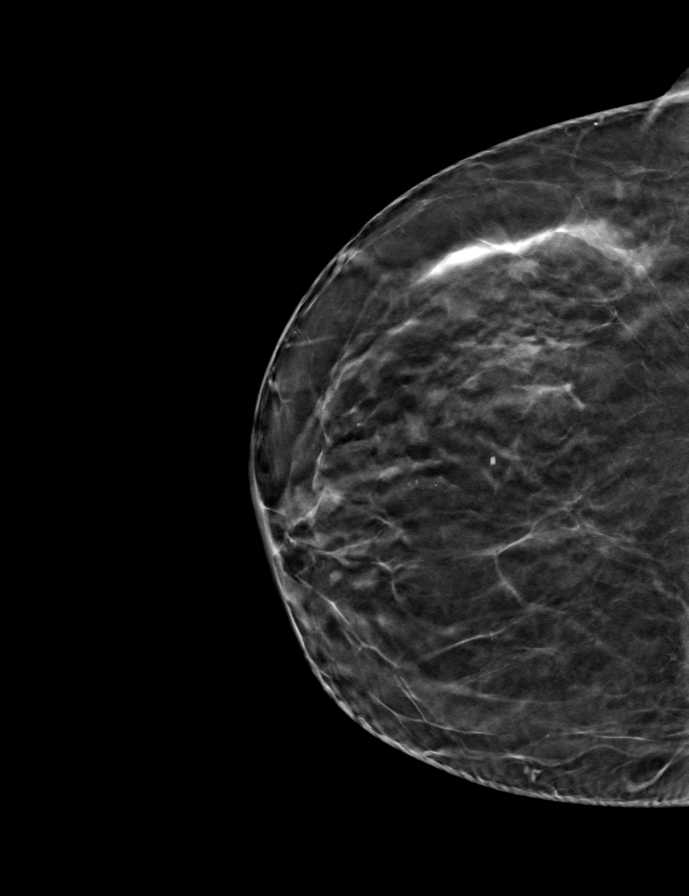

[L MLO tomo · tomo slice 38/75.0]
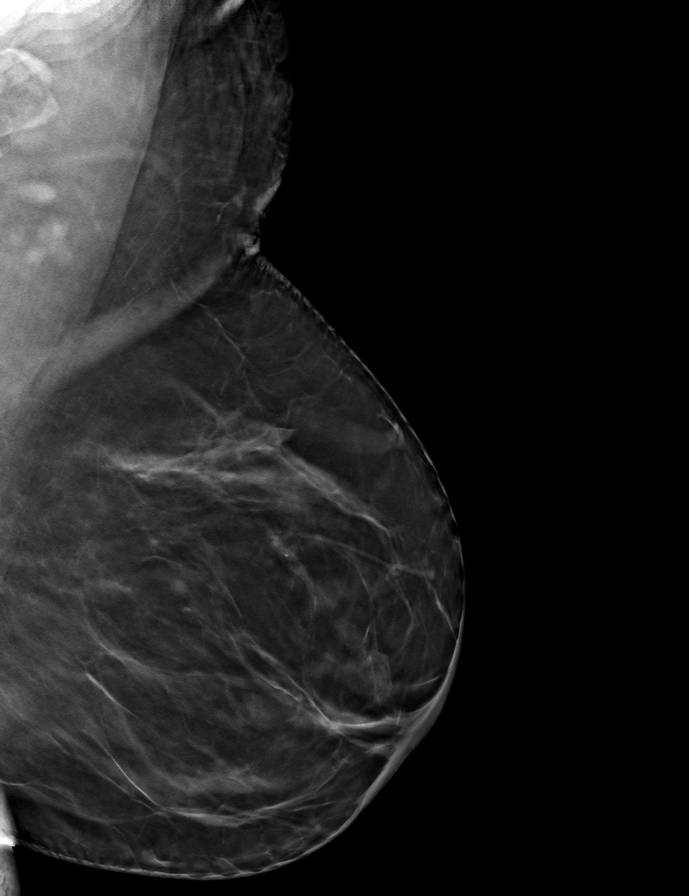

[L CC tomo · tomo slice 32/63.0]
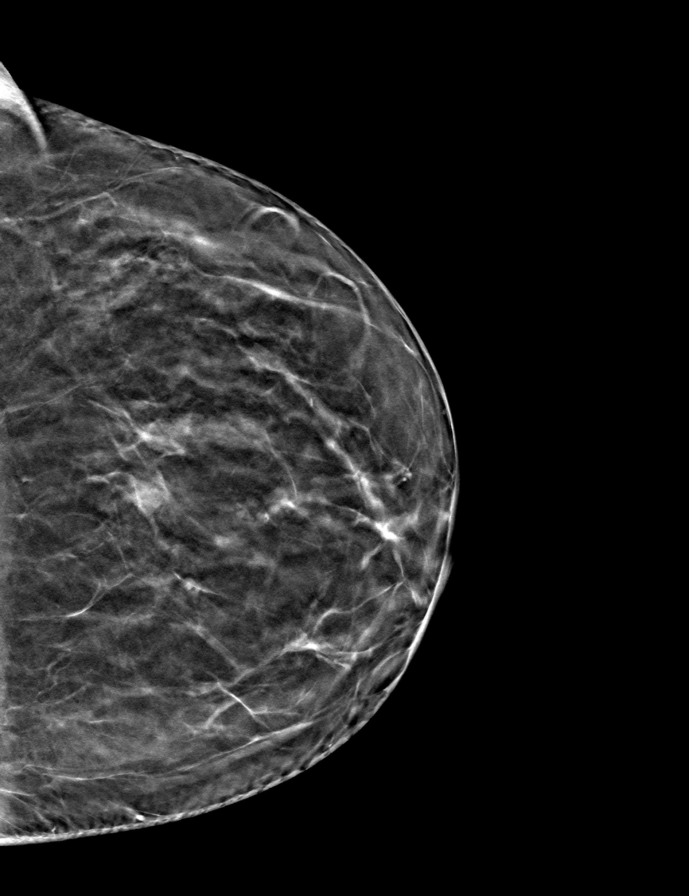

[9 of 24 positions shown; findings below may reference images not displayed]

ACR Breast Density Category b: There are scattered areas of
fibroglandular density.
FINDINGS: There are no findings suspicious for malignancy.
IMPRESSION: No mammographic evidence of malignancy. A result letter of this
screening mammogram will be mailed directly to the patient.

RECOMMENDATION:
Screening mammogram in one year. (Code:51-O-LD2)

BI-RADS CATEGORY  1: Negative.

## 2023-04-20 ENCOUNTER — Other Ambulatory Visit (HOSPITAL_BASED_OUTPATIENT_CLINIC_OR_DEPARTMENT_OTHER): Payer: Self-pay

## 2023-04-20 MED ORDER — AREXVY 120 MCG/0.5ML IM SUSR
0.5000 mL | Freq: Once | INTRAMUSCULAR | 0 refills | Status: AC
  Filled 2023-04-20: qty 0.5, 1d supply, fill #0

## 2023-05-25 DIAGNOSIS — Z23 Encounter for immunization: Secondary | ICD-10-CM | POA: Diagnosis not present

## 2023-08-05 DIAGNOSIS — R739 Hyperglycemia, unspecified: Secondary | ICD-10-CM | POA: Diagnosis not present

## 2023-08-05 DIAGNOSIS — Z0000 Encounter for general adult medical examination without abnormal findings: Secondary | ICD-10-CM | POA: Diagnosis not present

## 2023-08-05 DIAGNOSIS — E785 Hyperlipidemia, unspecified: Secondary | ICD-10-CM | POA: Diagnosis not present

## 2023-08-10 DIAGNOSIS — I10 Essential (primary) hypertension: Secondary | ICD-10-CM | POA: Diagnosis not present

## 2023-08-10 DIAGNOSIS — R7989 Other specified abnormal findings of blood chemistry: Secondary | ICD-10-CM | POA: Diagnosis not present

## 2023-08-10 DIAGNOSIS — Z0000 Encounter for general adult medical examination without abnormal findings: Secondary | ICD-10-CM | POA: Diagnosis not present

## 2023-08-20 ENCOUNTER — Other Ambulatory Visit: Payer: Self-pay | Admitting: Internal Medicine

## 2023-08-20 DIAGNOSIS — Z1231 Encounter for screening mammogram for malignant neoplasm of breast: Secondary | ICD-10-CM

## 2023-08-26 DIAGNOSIS — K760 Fatty (change of) liver, not elsewhere classified: Secondary | ICD-10-CM | POA: Diagnosis not present

## 2023-08-26 DIAGNOSIS — R7401 Elevation of levels of liver transaminase levels: Secondary | ICD-10-CM | POA: Diagnosis not present

## 2023-09-14 ENCOUNTER — Ambulatory Visit

## 2023-09-29 ENCOUNTER — Encounter: Payer: Self-pay | Admitting: Internal Medicine

## 2023-11-04 ENCOUNTER — Encounter: Payer: Self-pay | Admitting: Internal Medicine

## 2023-11-25 ENCOUNTER — Ambulatory Visit (AMBULATORY_SURGERY_CENTER)

## 2023-11-25 ENCOUNTER — Encounter: Payer: Self-pay | Admitting: Internal Medicine

## 2023-11-25 VITALS — Ht 58.5 in | Wt 150.0 lb

## 2023-11-25 DIAGNOSIS — Z860100 Personal history of colon polyps, unspecified: Secondary | ICD-10-CM

## 2023-11-25 MED ORDER — NA SULFATE-K SULFATE-MG SULF 17.5-3.13-1.6 GM/177ML PO SOLN
1.0000 | Freq: Once | ORAL | 0 refills | Status: AC
Start: 2023-11-25 — End: 2023-11-25

## 2023-11-25 NOTE — Progress Notes (Signed)
 Pre visit completed via phone call; Patient verified name, DOB, and address; No egg or soy allergy known to patient;  No issues known to pt with past sedation with any surgeries or procedures; Patient denies ever being told they had issues or difficulty with intubation;  No FH of Malignant Hyperthermia; Pt is not on diet pills; Pt is not on home 02;  Pt is not on blood thinners;  Pt denies issues with constipation;  No A fib or A flutter; Have any cardiac testing pending--NO Insurance verified during PV appt---BCBS Pt can ambulate without assistance;  Pt denies use of chewing tobacco; Discussed diabetic/weight loss medication holds; Discussed NSAID holds; Checked BMI to be less than 50; Pt instructed to use Single[employer] or GoodRx for a price reduction on prep;  Patient's chart reviewed by Norleen Schillings CNRA prior to previsit and patient appropriate for the [facility];  Pre visit completed and red dot placed by patient's name on their procedure day (on provider's schedule); Instructions printed and mailed to the patient per her request;

## 2023-11-30 ENCOUNTER — Ambulatory Visit
Admission: RE | Admit: 2023-11-30 | Discharge: 2023-11-30 | Disposition: A | Discharge status: Home / Self Care | Source: Ambulatory Visit | Attending: Internal Medicine | Admitting: Internal Medicine

## 2023-11-30 DIAGNOSIS — Z1231 Encounter for screening mammogram for malignant neoplasm of breast: Secondary | ICD-10-CM | POA: Diagnosis not present

## 2023-12-14 ENCOUNTER — Encounter: Payer: Self-pay | Admitting: Internal Medicine

## 2023-12-14 ENCOUNTER — Ambulatory Visit (AMBULATORY_SURGERY_CENTER): Admitting: Internal Medicine

## 2023-12-14 VITALS — BP 104/71 | HR 76 | Temp 97.9°F | Resp 11 | Ht 58.5 in | Wt 150.0 lb

## 2023-12-14 DIAGNOSIS — Z860101 Personal history of adenomatous and serrated colon polyps: Secondary | ICD-10-CM

## 2023-12-14 DIAGNOSIS — Z1211 Encounter for screening for malignant neoplasm of colon: Secondary | ICD-10-CM | POA: Diagnosis not present

## 2023-12-14 DIAGNOSIS — D120 Benign neoplasm of cecum: Secondary | ICD-10-CM | POA: Diagnosis not present

## 2023-12-14 DIAGNOSIS — Z860100 Personal history of colon polyps, unspecified: Secondary | ICD-10-CM

## 2023-12-14 DIAGNOSIS — D122 Benign neoplasm of ascending colon: Secondary | ICD-10-CM | POA: Diagnosis not present

## 2023-12-14 MED ORDER — SODIUM CHLORIDE 0.9 % IV SOLN: 500.0000 mL | Freq: Once | INTRAVENOUS | Status: DC

## 2023-12-14 NOTE — Progress Notes (Signed)
 Pt's states no medical or surgical changes since previsit or office visit.

## 2023-12-14 NOTE — Progress Notes (Signed)
 Frances White called due to vomiting second half of colonoscopy prep this AM.  Had good results with first half.  Advised to take 4 doses of Miralax and Gatorade plus 16 oz water and be NPO after 0500

## 2023-12-14 NOTE — Patient Instructions (Signed)

## 2023-12-14 NOTE — Progress Notes (Signed)
 HISTORY OF PRESENT ILLNESS:  Frances White is a 63 y.o. female with a history of multiple adenomatous colon polyps.  Presents today for surveillance colonoscopy.  No complaints.  REVIEW OF SYSTEMS:  All non-GI ROS negative except for  Past Medical History:  Diagnosis Date   Allergy    Diabetes (HCC)    Hyperlipidemia    Hypertension    OSA (obstructive sleep apnea)     Past Surgical History:  Procedure Laterality Date   COLONOSCOPY  2018   JP-MAC-suprep(good)-TAx1,recall 5 yrs   WISDOM TOOTH EXTRACTION      Social History Frances White  reports that she has never smoked. She has never used smokeless tobacco. She reports current alcohol use of about 2.0 standard drinks of alcohol per week. She reports that she does not use drugs.  family history includes Acute myelogenous leukemia (age of onset: 9) in her father; Allergies in her father; Crohn's disease in her daughter; Heart disease in her father; Hypertension in her father and mother; Rheum arthritis in her father.  Allergies  Allergen Reactions   Sulfa Antibiotics Rash    rash   Sulfasalazine Rash    rash       PHYSICAL EXAMINATION: Vital signs: BP 104/62   Pulse 88   Temp 97.9 F (36.6 C) (Temporal)   Resp 13   Ht 4' 10.5 (1.486 m)   Wt 150 lb (68 kg)   SpO2 96%   BMI 30.82 kg/m  General: Well-developed, well-nourished, no acute distress HEENT: Sclerae are anicteric, conjunctiva pink. Oral mucosa intact Lungs: Clear Heart: Regular Abdomen: soft, nontender, nondistended, no obvious ascites, no peritoneal signs, normal bowel sounds. No organomegaly. Extremities: No edema Psychiatric: alert and oriented x3. Cooperative     ASSESSMENT:  Personal history of multiple adenomatous polyps   PLAN:  Surveillance colonoscopy

## 2023-12-14 NOTE — Progress Notes (Signed)
 Report to PACU, RN, vss, BBS= Clear.

## 2023-12-14 NOTE — Op Note (Signed)
 [facility] Patient Name: Frances White Procedure Date: 12/14/2023 7:34 AM MRN: [id number] Endoscopist: Norleen SAILOR. Abran , MD, [id number] Age: 63 Referring MD:  Date of Birth: 1960/11/30 Gender: Female Account #: [id number] Procedure:                Colonoscopy with cold snare polypectomy x 1; biopsy                            polypectomy x 1 Indications:              High risk colon cancer surveillance: Personal                            history of multiple (3 or more) adenomas. Previous                            examinations 2014, 2018 Medicines:                Monitored Anesthesia Care Procedure:                Pre-Anesthesia Assessment:                           - Prior to the procedure, a History and Physical                            was performed, and patient medications and                            allergies were reviewed. The patient's tolerance of                            previous anesthesia was also reviewed. The risks                            and benefits of the procedure and the sedation                            options and risks were discussed with the patient.                            All questions were answered, and informed consent                            was obtained. Prior Anticoagulants: The patient has                            taken no anticoagulant or antiplatelet agents. ASA                            Grade Assessment: II - A patient with mild systemic                            disease. After reviewing the risks and benefits,  the patient was deemed in satisfactory condition to                            undergo the procedure.                           After obtaining informed consent, the colonoscope                            was passed under direct vision. Throughout the                            procedure, the patient's blood pressure, pulse, and                            oxygen saturations were monitored  continuously. The                            Olympus CF-HQ190L ([id number]) Colonoscope was                            introduced through the anus and advanced to the the                            cecum, identified by appendiceal orifice and                            ileocecal valve. The terminal ileum, ileocecal                            valve, appendiceal orifice, and rectum were                            photographed. The quality of the bowel preparation                            was excellent. The colonoscopy was performed                            without difficulty. The patient tolerated the                            procedure well. The bowel preparation used was                            SUPREP via split dose instruction. Scope In: 8:27:29 AM Scope Out: 8:39:53 AM Scope Withdrawal Time: 0 hours 10 minutes 42 seconds  Total Procedure Duration: 0 hours 12 minutes 24 seconds  Findings:                 The terminal ileum appeared normal.                           A 3 mm polyp was found in the cecum. The polyp was  removed with a cold snare. Resection and retrieval                            were complete.                           A 1 mm polyp was found in the ascending colon. The                            polyp was removed with a jumbo cold forceps.                            Resection and retrieval were complete.                           The exam was otherwise without abnormality on                            direct and retroflexion views. Complications:            No immediate complications. Estimated blood loss:                            None. Estimated Blood Loss:     Estimated blood loss: none. Impression:               - One 3 mm polyp in the cecum, removed with a cold                            snare. Resected and retrieved.                           - One 1 mm polyp in the ascending colon, removed                            with a jumbo  cold forceps. Resected and retrieved.                           - The examination of the colon was otherwise normal                            on direct and retroflexion views. The terminal                            ileum was normal. Recommendation:           - Repeat colonoscopy in 5 years for surveillance.                           - Patient has a contact number available for                            emergencies. The signs and symptoms of potential  delayed complications were discussed with the                            patient. Return to normal activities tomorrow.                            Written discharge instructions were provided to the                            patient.                           - Resume previous diet.                           - Continue present medications.                           - Await pathology results. Norleen SAILOR. Abran, MD 12/14/2023 9:01:25 AM This report has been signed electronically.

## 2023-12-15 ENCOUNTER — Telehealth: Payer: Self-pay | Admitting: Lactation Services

## 2023-12-15 NOTE — Telephone Encounter (Signed)
  Follow up Call-     12/14/2023    7:24 AM  Call back number  Post procedure Call Back phone  # [phone]  Permission to leave phone message Yes     Patient questions:  Do you have a fever, pain , or abdominal swelling? No. Pain Score  0 *  Have you tolerated food without any problems? Yes.    Have you been able to return to your normal activities? Yes.    Do you have any questions about your discharge instructions: Diet   No. Medications  No. Follow up visit  No.  Do you have questions or concerns about your Care? No.  Actions: * If pain score is 4 or above: No action needed, pain <4.

## 2023-12-16 ENCOUNTER — Ambulatory Visit: Payer: Self-pay | Admitting: Internal Medicine

## 2023-12-16 LAB — SURGICAL PATHOLOGY

## 2024-03-06 DIAGNOSIS — Z23 Encounter for immunization: Secondary | ICD-10-CM | POA: Diagnosis not present

## 2024-04-26 DIAGNOSIS — H5213 Myopia, bilateral: Secondary | ICD-10-CM | POA: Diagnosis not present

## 2024-04-26 DIAGNOSIS — H524 Presbyopia: Secondary | ICD-10-CM | POA: Diagnosis not present

## 2024-04-26 DIAGNOSIS — H2513 Age-related nuclear cataract, bilateral: Secondary | ICD-10-CM | POA: Diagnosis not present

## 2024-04-26 DIAGNOSIS — E119 Type 2 diabetes mellitus without complications: Secondary | ICD-10-CM | POA: Diagnosis not present

## 2024-04-26 DIAGNOSIS — H52203 Unspecified astigmatism, bilateral: Secondary | ICD-10-CM | POA: Diagnosis not present

## 2024-04-26 DIAGNOSIS — H35711 Central serous chorioretinopathy, right eye: Secondary | ICD-10-CM | POA: Diagnosis not present

## 2024-04-26 DIAGNOSIS — H25013 Cortical age-related cataract, bilateral: Secondary | ICD-10-CM | POA: Diagnosis not present

## 2024-05-31 ENCOUNTER — Telehealth: Payer: Self-pay | Admitting: *Deleted

## 2024-05-31 NOTE — Telephone Encounter (Signed)
 Advised pt appt needed. Last ov 2023/6 Copied from CRM #[id number]. Topic: Clinical - Order For Equipment >> May 31, 2024  9:34 AM Rilla B wrote: Reason for CRM: Patient states her CPAP is broken and had to use her travel CPAP last night.  Patient would like an order for a new CPAP. Please call patient at [phone].

## 2024-06-02 ENCOUNTER — Ambulatory Visit (INDEPENDENT_AMBULATORY_CARE_PROVIDER_SITE_OTHER): Admitting: Pulmonary Disease

## 2024-06-02 ENCOUNTER — Encounter (HOSPITAL_BASED_OUTPATIENT_CLINIC_OR_DEPARTMENT_OTHER): Payer: Self-pay | Admitting: Pulmonary Disease

## 2024-06-02 VITALS — BP 125/76 | HR 75 | Ht <= 58 in | Wt 148.0 lb

## 2024-06-02 DIAGNOSIS — G4733 Obstructive sleep apnea (adult) (pediatric): Secondary | ICD-10-CM | POA: Diagnosis not present

## 2024-06-02 DIAGNOSIS — G4721 Circadian rhythm sleep disorder, delayed sleep phase type: Secondary | ICD-10-CM

## 2024-06-02 NOTE — Patient Instructions (Signed)
" °  VISIT SUMMARY: During your visit, we discussed your obstructive sleep apnea and delayed sleep phase syndrome. You mentioned issues with your primary CPAP machine and your current use of a travel CPAP. We also talked about your sleep medication and the impact of family stress on your sleep.  YOUR PLAN: -OBSTRUCTIVE SLEEP APNEA: Obstructive sleep apnea is a condition where your breathing stops and starts during sleep due to blocked airways. We discussed the malfunction of your primary CPAP machine and the use of your travel CPAP as a temporary solution. You should contact the DME supplier to assess and possibly replace your CPAP machine. We also talked about Inspire therapy as a future option, but you prefer to wait for further advancements. Continue using your travel CPAP as needed.  -DELAYED SLEEP PHASE SYNDROME: Delayed sleep phase syndrome is a sleep disorder where your sleep is delayed by two or more hours beyond the conventional bedtime, causing difficulty in waking up at a desired time. You are currently using Ambien to help with sleep onset and duration, which has been effective. We encourage you to align your bedtime with your partner's schedule to improve CPAP compliance.  INSTRUCTIONS: Please contact the DME supplier to assess your CPAP machine and determine if a replacement is necessary. Continue using your travel CPAP as needed. Align your bedtime with your partner's schedule to improve CPAP compliance.    Contains text generated by [employer].   "

## 2024-06-02 NOTE — Progress Notes (Signed)
 "  Subjective:    Patient ID: Frances White, female    DOB: 1961/04/14, 64 y.o.   MRN: [id number]     48 yo neighbor care for FU of  OSA & delayed sleep phase syndrome  She was diagnosed in 2014 with severe OSA especially in the supine position. Initially was maintained on CPAP of 8 cm with nasal pillows and subsequently due to persistent snoring change to auto CPAP 5 to 12 cm.  CPAP replaced 2023     Discussed the use of AI scribe software for clinical note transcription with the patient, who gave verbal consent to proceed.  History of Present Illness Frances White is a 64 year old female with obstructive sleep apnea and delayed sleep phase syndrome who presents for follow-up.  She reports malfunction of her primary CPAP machine due to a faulty heating element, causing intermittent operation. She is currently using her travel CPAP as a temporary replacement and has not yet contacted the supplier for repair or replacement.  Her CPAP adherence is inconsistent. She often falls asleep in another room after reading and does not apply the CPAP. When she uses CPAP, she finds it helpful but feels her mental clarity is more related to sleep medication than to CPAP use.  She takes half a tablet of Ambien at night, which gives her about 5.5 to 6 hours of sleep. She switched from doxepin to Ambien and finds Ambien more effective for faster sleep onset.  Her prediabetes has progressed to diabetes. She is on Mounjaro after switching from Ozempic 4 to 5 months ago and has not had significant weight loss.  She reports ongoing stress from family responsibilities and caregiving, which she feels worsens around the holidays and affects her sleep.     Significant tests/ events reviewed   NPSG 06/2012 - 149lbs -  AHI 41/h , lowest 73 %, mostly supine >> CPAP 8 cm , small FF mask  Review of Systems  neg for any significant sore throat, dysphagia, itching, sneezing, nasal congestion or excess/ purulent secretions,  fever, chills, sweats, unintended wt loss, pleuritic or exertional cp, hempoptysis, orthopnea pnd or change in chronic leg swelling. Also denies presyncope, palpitations, heartburn, abdominal pain, nausea, vomiting, diarrhea or change in bowel or urinary habits, dysuria,hematuria, rash, arthralgias, visual complaints, headache, numbness weakness or ataxia.      Objective:   Physical Exam   Gen. Pleasant, obese, in no distress ENT - no lesions, no post nasal drip Neck: No JVD, no thyromegaly, no carotid bruits Lungs: no use of accessory muscles, no dullness to percussion, decreased without rales or rhonchi  Cardiovascular: Rhythm regular, heart sounds  normal, no murmurs or gallops, no peripheral edema Musculoskeletal: No deformities, no cyanosis or clubbing , no tremors         Assessment & Plan:   Assessment and Plan Assessment & Plan Obstructive sleep apnea Managed with CPAP therapy. Current CPAP machine malfunctioning, using travel CPAP as a temporary solution. CPAP effective when used, but compliance is inconsistent due to environmental factors and personal schedule. Discussed potential for Inspire therapy, but she is not ready due to current efficacy and preference for CPAP when used. Inspire therapy could provide continuous therapy and improve compliance, but she prefers to wait for further technological advancements. Inspire therapy has shown 80% efficacy in similar patients, with potential for continuous therapy and improved compliance. - Contacted DME to assess CPAP machine and determine if replacement is necessary. - Provided phone number for DME contact. -  Continue using travel CPAP as needed. - Discussed Inspire therapy as a future option, but she prefers to wait for further advancements.  Delayed sleep phase syndrome Contributing to difficulty with CPAP compliance. She is a night owl and often reads in another room to avoid conflict with partner's bedtime. Ambien has been  effective in aiding sleep onset and duration, improving sleep quality. She is working on aligning bedtime with partner's schedule to improve compliance. - Continue Ambien for sleep aid. - Encouraged aligning bedtime with partner's schedule to improve CPAP compliance.     "
